# Patient Record
Sex: Female | Born: 1960 | Race: White | Hispanic: No | Marital: Married | Smoking: Current every day smoker
Health system: Southern US, Community
[De-identification: ages and names within clinical notes are randomized; demographics above are authoritative.]

## PROBLEM LIST (undated history)

## (undated) DIAGNOSIS — F32A Depression, unspecified: Secondary | ICD-10-CM

## (undated) DIAGNOSIS — E785 Hyperlipidemia, unspecified: Secondary | ICD-10-CM

## (undated) DIAGNOSIS — F329 Major depressive disorder, single episode, unspecified: Secondary | ICD-10-CM

## (undated) DIAGNOSIS — F419 Anxiety disorder, unspecified: Secondary | ICD-10-CM

## (undated) DIAGNOSIS — I10 Essential (primary) hypertension: Secondary | ICD-10-CM

## (undated) DIAGNOSIS — J40 Bronchitis, not specified as acute or chronic: Secondary | ICD-10-CM

## (undated) HISTORY — PX: ABDOMINAL HYSTERECTOMY: SHX81

## (undated) HISTORY — PX: TUBAL LIGATION: SHX77

## (undated) HISTORY — PX: WISDOM TOOTH EXTRACTION: SHX21

---

## 2005-03-20 ENCOUNTER — Ambulatory Visit: Payer: Self-pay | Admitting: Family Medicine

## 2006-11-21 ENCOUNTER — Emergency Department: Payer: Self-pay | Admitting: Emergency Medicine

## 2006-12-31 ENCOUNTER — Ambulatory Visit: Payer: Self-pay | Admitting: Family Medicine

## 2008-11-25 ENCOUNTER — Ambulatory Visit: Payer: Self-pay | Admitting: Physician Assistant

## 2008-12-07 ENCOUNTER — Ambulatory Visit: Payer: Self-pay | Admitting: Family Medicine

## 2010-03-10 ENCOUNTER — Ambulatory Visit: Payer: Self-pay | Admitting: Family Medicine

## 2010-03-21 ENCOUNTER — Ambulatory Visit: Payer: Self-pay | Admitting: Family Medicine

## 2010-10-07 ENCOUNTER — Ambulatory Visit: Payer: Self-pay

## 2012-02-20 ENCOUNTER — Ambulatory Visit: Payer: Self-pay | Admitting: Family Medicine

## 2012-02-26 ENCOUNTER — Ambulatory Visit: Payer: Self-pay | Admitting: Family Medicine

## 2013-05-07 ENCOUNTER — Ambulatory Visit: Payer: Self-pay | Admitting: Family Medicine

## 2014-05-19 ENCOUNTER — Ambulatory Visit: Payer: Self-pay | Admitting: Family Medicine

## 2014-05-19 IMAGING — CT CT HEAD WITHOUT CONTRAST
1 series · 16 of 30 positions shown, 20 images · non-contrast
Comparison: None.

CLINICAL DATA: Headache for 3 days

EXAM:
CT HEAD WITHOUT CONTRAST
TECHNIQUE: Contiguous axial images were obtained from the base of the skull
through the vertex without intravenous contrast.

[Series 2: head wo · axial · 0.39mm/px · z∈[-74,+52]mm · 16 of 32 slices shown, 20 images]
[im 2/32  brain]
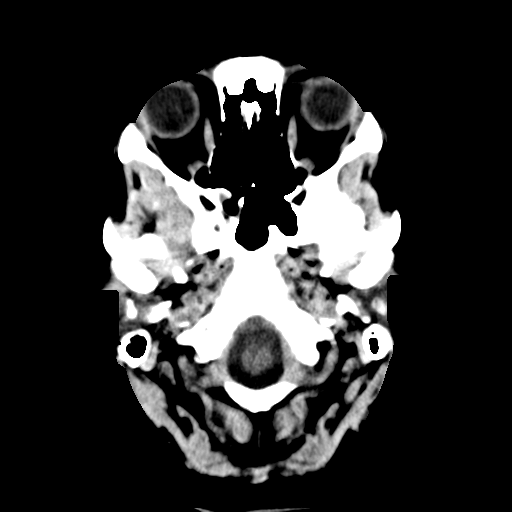
[im 2/32  bone]
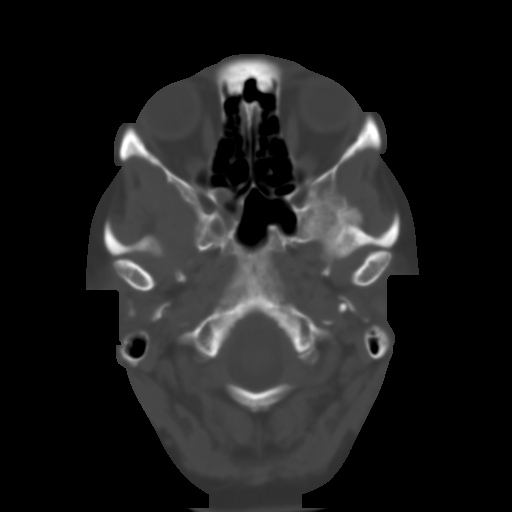
[im 4/32  brain]
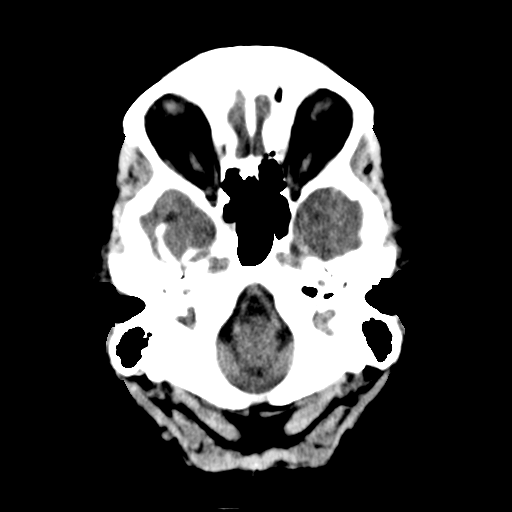
[im 6/32  brain]
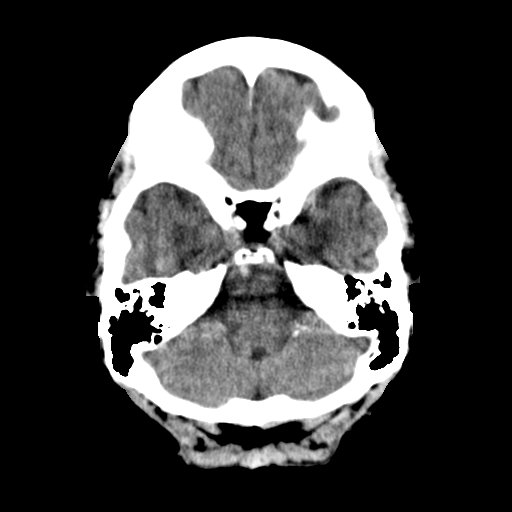
[im 8/32  brain]
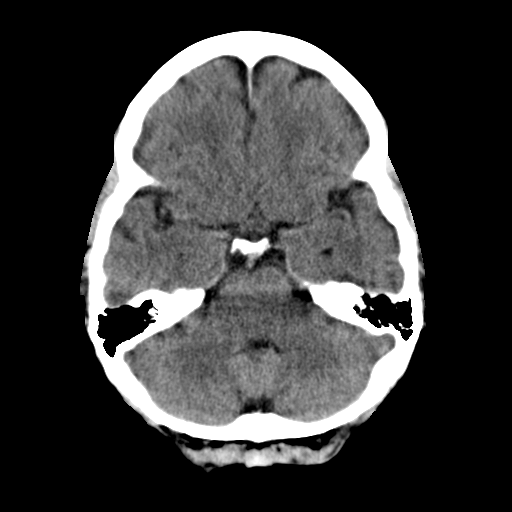
[im 9/32  brain]
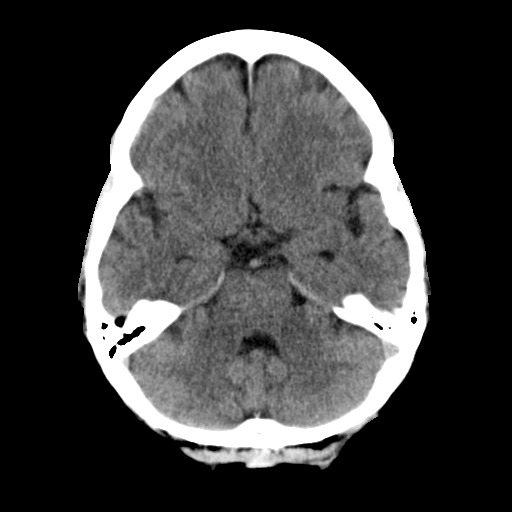
[im 9/32  bone]
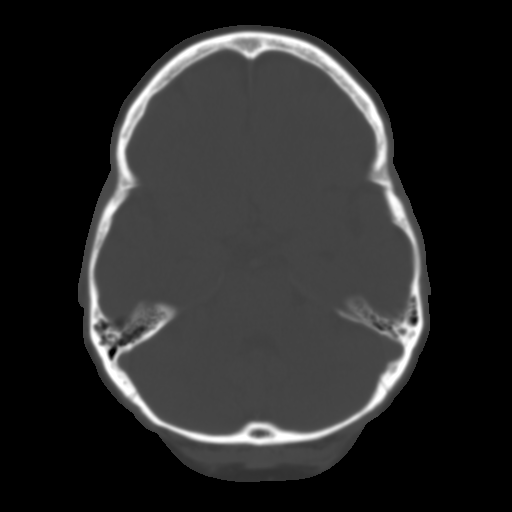
[im 11/32  brain]
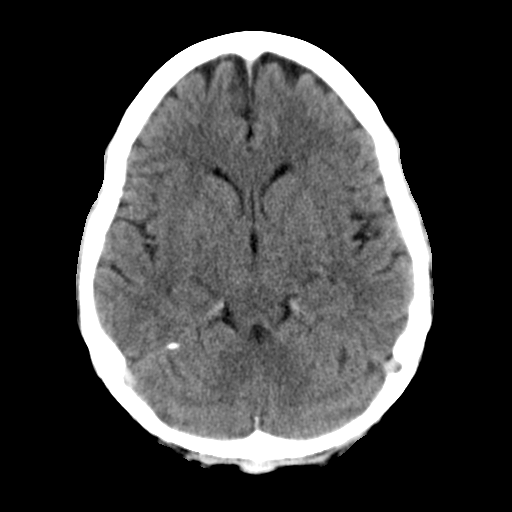
[im 13/32  brain]
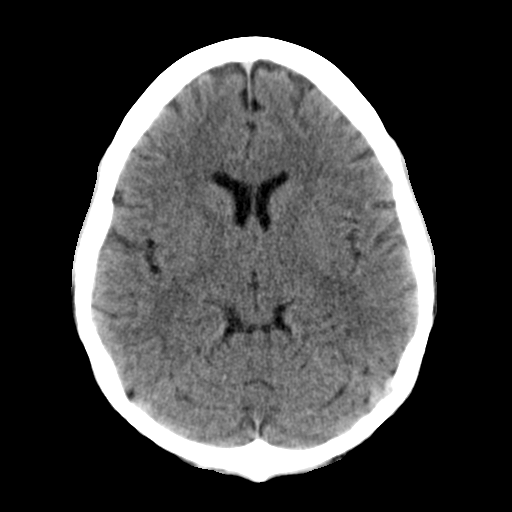
[im 15/32  brain]
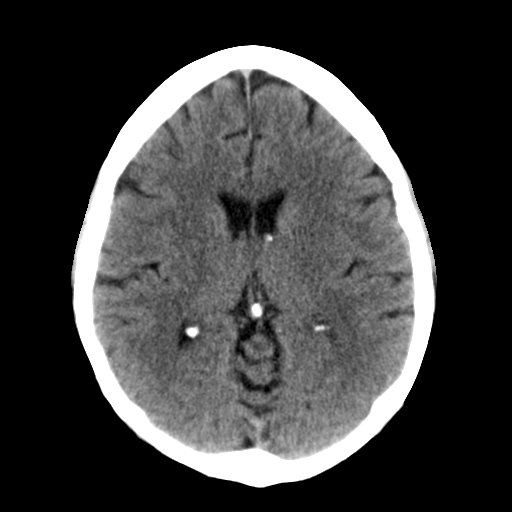
[im 17/32  brain]
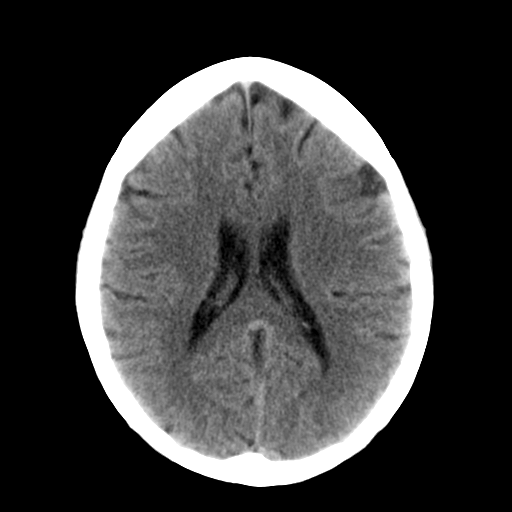
[im 17/32  bone]
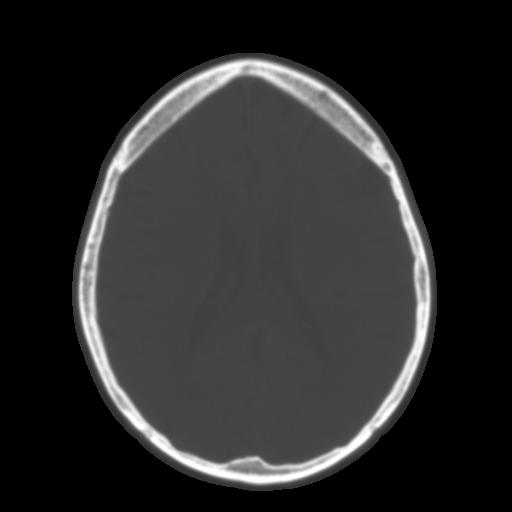
[im 19/32  brain]
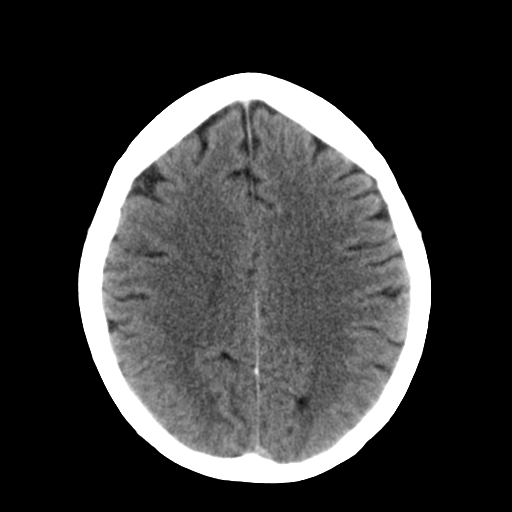
[im 21/32  brain]
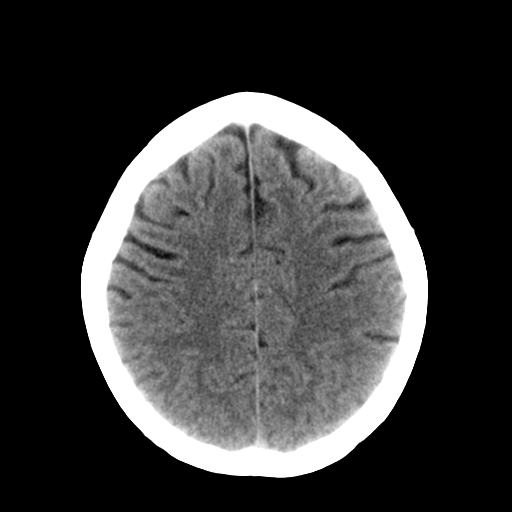
[im 23/32  brain]
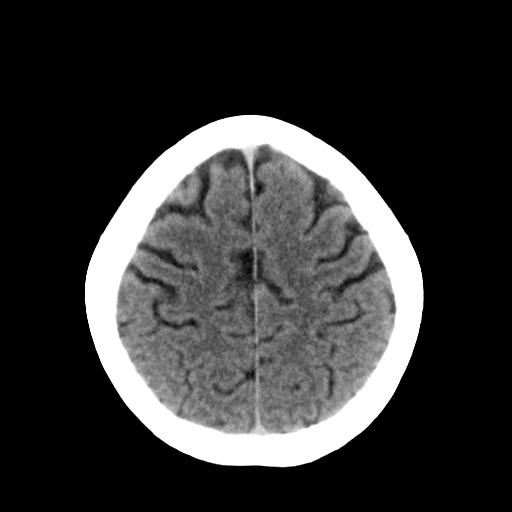
[im 24/32  brain]
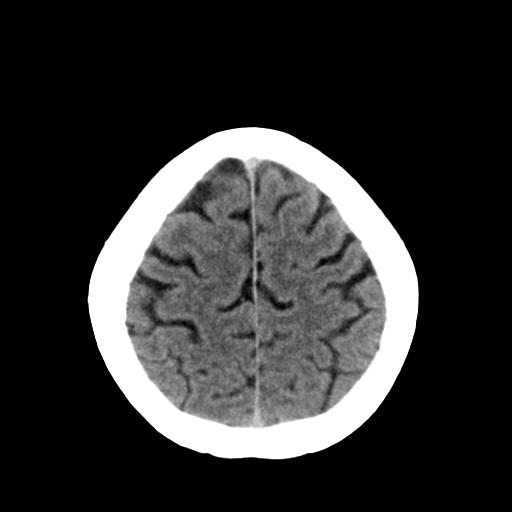
[im 24/32  bone]
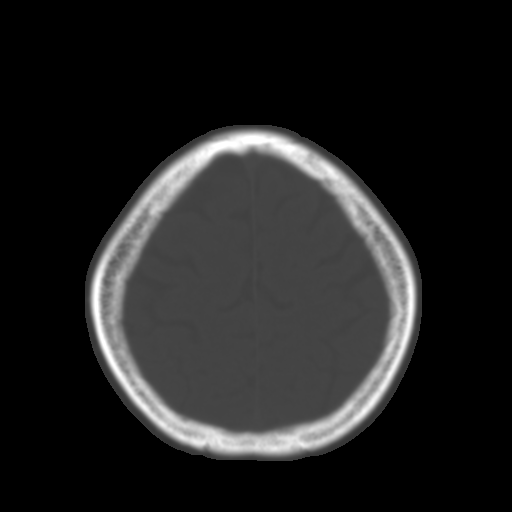
[im 26/32  brain]
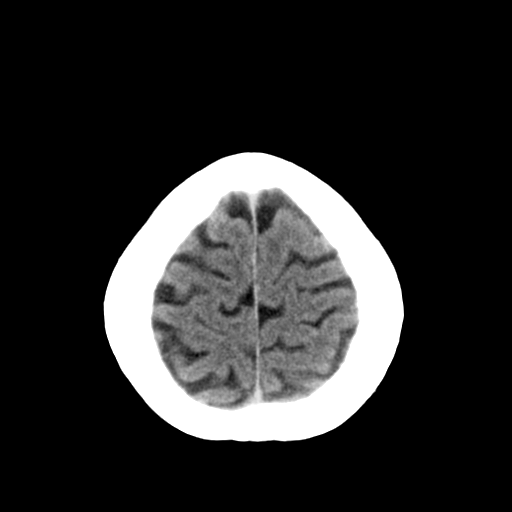
[im 28/32  brain]
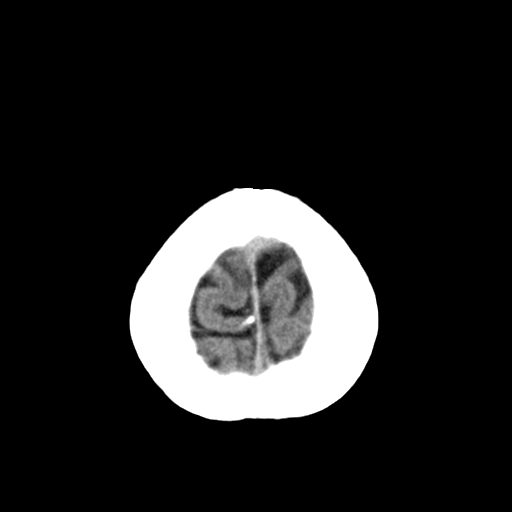
[im 30/32  brain]
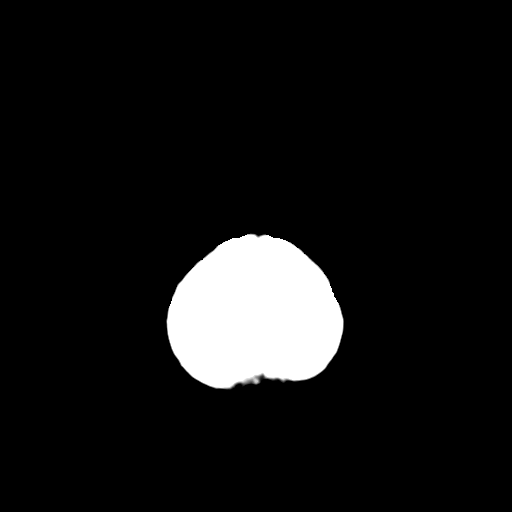

[16 of 30 positions shown; findings below may reference images not displayed]

FINDINGS: No skull fracture is noted. Paranasal sinuses and mastoid air cells
are unremarkable. No intracranial hemorrhage, mass effect or midline
shift. No acute infarction. No mass lesion is noted on this
unenhanced scan. The gray and white-matter differentiation is
preserved. No hydrocephalus.
IMPRESSION: No acute intracranial abnormality.

## 2014-10-30 ENCOUNTER — Ambulatory Visit: Payer: Self-pay | Admitting: Obstetrics and Gynecology

## 2014-11-05 ENCOUNTER — Ambulatory Visit: Payer: Self-pay | Admitting: Obstetrics and Gynecology

## 2015-02-05 ENCOUNTER — Other Ambulatory Visit: Payer: Self-pay | Admitting: Family Medicine

## 2015-02-05 DIAGNOSIS — R1032 Left lower quadrant pain: Secondary | ICD-10-CM

## 2015-02-09 ENCOUNTER — Ambulatory Visit
Admission: RE | Admit: 2015-02-09 | Discharge: 2015-02-09 | Disposition: A | Discharge status: Home / Self Care | Payer: BLUE CROSS/BLUE SHIELD | Source: Ambulatory Visit | Attending: Family Medicine | Admitting: Family Medicine

## 2015-02-09 ENCOUNTER — Other Ambulatory Visit: Payer: Self-pay | Admitting: Family Medicine

## 2015-02-09 DIAGNOSIS — R1032 Left lower quadrant pain: Secondary | ICD-10-CM | POA: Insufficient documentation

## 2015-02-09 MED ORDER — IOHEXOL 350 MG/ML SOLN
100.0000 mL | Freq: Once | INTRAVENOUS | Status: AC | PRN
  Administered 2015-02-09: 100 mL via INTRAVENOUS

## 2015-04-06 ENCOUNTER — Other Ambulatory Visit: Payer: Self-pay | Admitting: Obstetrics and Gynecology

## 2015-04-06 DIAGNOSIS — N6002 Solitary cyst of left breast: Secondary | ICD-10-CM

## 2015-05-13 ENCOUNTER — Encounter: Payer: Self-pay | Admitting: *Deleted

## 2015-05-14 ENCOUNTER — Ambulatory Visit
Admission: RE | Admit: 2015-05-14 | Discharge: 2015-05-14 | Disposition: A | Discharge status: Home / Self Care | Payer: BLUE CROSS/BLUE SHIELD | Source: Ambulatory Visit | Attending: Gastroenterology | Admitting: Gastroenterology

## 2015-05-14 ENCOUNTER — Encounter: Payer: Self-pay | Admitting: Anesthesiology

## 2015-05-14 ENCOUNTER — Ambulatory Visit: Payer: BLUE CROSS/BLUE SHIELD | Admitting: Certified Registered"

## 2015-05-14 ENCOUNTER — Encounter
Admission: RE | Disposition: A | Discharge status: Home / Self Care | Payer: Self-pay | Source: Ambulatory Visit | Attending: Gastroenterology

## 2015-05-14 DIAGNOSIS — I10 Essential (primary) hypertension: Secondary | ICD-10-CM | POA: Diagnosis not present

## 2015-05-14 DIAGNOSIS — K640 First degree hemorrhoids: Secondary | ICD-10-CM | POA: Insufficient documentation

## 2015-05-14 DIAGNOSIS — F17210 Nicotine dependence, cigarettes, uncomplicated: Secondary | ICD-10-CM | POA: Diagnosis not present

## 2015-05-14 DIAGNOSIS — K621 Rectal polyp: Secondary | ICD-10-CM | POA: Diagnosis not present

## 2015-05-14 DIAGNOSIS — E785 Hyperlipidemia, unspecified: Secondary | ICD-10-CM | POA: Insufficient documentation

## 2015-05-14 DIAGNOSIS — Z79899 Other long term (current) drug therapy: Secondary | ICD-10-CM | POA: Diagnosis not present

## 2015-05-14 DIAGNOSIS — F419 Anxiety disorder, unspecified: Secondary | ICD-10-CM | POA: Diagnosis not present

## 2015-05-14 DIAGNOSIS — Z1211 Encounter for screening for malignant neoplasm of colon: Secondary | ICD-10-CM | POA: Diagnosis not present

## 2015-05-14 DIAGNOSIS — K5730 Diverticulosis of large intestine without perforation or abscess without bleeding: Secondary | ICD-10-CM | POA: Diagnosis not present

## 2015-05-14 DIAGNOSIS — D125 Benign neoplasm of sigmoid colon: Secondary | ICD-10-CM | POA: Insufficient documentation

## 2015-05-14 DIAGNOSIS — F329 Major depressive disorder, single episode, unspecified: Secondary | ICD-10-CM | POA: Diagnosis not present

## 2015-05-14 HISTORY — DX: Essential (primary) hypertension: I10

## 2015-05-14 HISTORY — DX: Depression, unspecified: F32.A

## 2015-05-14 HISTORY — PX: COLONOSCOPY WITH PROPOFOL: SHX5780

## 2015-05-14 HISTORY — DX: Bronchitis, not specified as acute or chronic: J40

## 2015-05-14 HISTORY — DX: Anxiety disorder, unspecified: F41.9

## 2015-05-14 HISTORY — DX: Hyperlipidemia, unspecified: E78.5

## 2015-05-14 HISTORY — DX: Major depressive disorder, single episode, unspecified: F32.9

## 2015-05-14 SURGERY — COLONOSCOPY WITH PROPOFOL
Anesthesia: General

## 2015-05-14 MED ORDER — PROPOFOL INFUSION 10 MG/ML OPTIME
INTRAVENOUS | Status: DC | PRN
  Administered 2015-05-14: 120 ug/kg/min via INTRAVENOUS

## 2015-05-14 MED ORDER — SODIUM CHLORIDE 0.9 % IV SOLN
INTRAVENOUS | Status: DC
  Administered 2015-05-14: 1000 mL via INTRAVENOUS

## 2015-05-14 MED ORDER — SODIUM CHLORIDE 0.9 % IV SOLN: INTRAVENOUS | Status: DC

## 2015-05-14 MED ORDER — LIDOCAINE HCL (CARDIAC) 20 MG/ML IV SOLN
INTRAVENOUS | Status: DC | PRN
  Administered 2015-05-14: 50 mg via INTRAVENOUS

## 2015-05-14 MED ORDER — PROPOFOL 10 MG/ML IV BOLUS
INTRAVENOUS | Status: DC | PRN
  Administered 2015-05-14: 30 mg via INTRAVENOUS
  Administered 2015-05-14: 20 mg via INTRAVENOUS
  Administered 2015-05-14: 70 mg via INTRAVENOUS

## 2015-05-14 NOTE — Transfer of Care (Signed)
Immediate Anesthesia Transfer of Care Note  Patient: Catherine Mcbride  Procedure(s) Performed: Procedure(s): COLONOSCOPY WITH PROPOFOL (N/A)  Patient Location: Endoscopy Unit  Anesthesia Type:General  Level of Consciousness: awake  Airway & Oxygen Therapy: Patient Spontanous Breathing and Patient connected to nasal cannula oxygen  Post-op Assessment: Report given to RN  Post vital signs: Reviewed  Last Vitals:  Filed Vitals:   05/14/15 1158  BP: 92/58  Pulse: 71  Temp: 35.8 C  Resp: 16    Complications: No apparent anesthesia complications

## 2015-05-14 NOTE — H&P (Signed)
  Primary Care Physician:  [facility], Chrissie Noa, MD  Pre-Procedure History & Physical: HPI:  Catherine Mcbride is a 54 y.o. female is here for an colonoscopy.   Past Medical History  Diagnosis Date  . Hypertension   . Anxiety   . Depression   . Bronchitis   . Hyperlipidemia     Past Surgical History  Procedure Laterality Date  . Abdominal hysterectomy    . Tubal ligation    . Wisdom tooth extraction      Prior to Admission medications   Medication Sig Start Date End Date Taking? Authorizing Provider  fluticasone (FLONASE) 50 MCG/ACT nasal spray Place 2 sprays into both nostrils daily.   Yes Historical Provider, MD  hydrochlorothiazide (HYDRODIURIL) 25 MG tablet Take 25 mg by mouth daily.   Yes Historical Provider, MD  lisinopril (PRINIVIL,ZESTRIL) 30 MG tablet Take 30 mg by mouth daily.   Yes Historical Provider, MD  niacin 500 MG tablet Take 500 mg by mouth daily with breakfast.   Yes Historical Provider, MD  OMEGA-3 FATTY ACIDS-VITAMIN E PO Take 1,000 mg by mouth daily.   Yes Historical Provider, MD    Allergies as of 05/11/2015  . (No Known Allergies)    History reviewed. No pertinent family history.  Social History   Social History  . Marital Status: Married    Spouse Name: N/A  . Number of Children: N/A  . Years of Education: N/A   Occupational History  . Not on file.   Social History Main Topics  . Smoking status: Current Every Day Smoker -- 1.00 packs/day  . Smokeless tobacco: Not on file  . Alcohol Use: Yes  . Drug Use: No  . Sexual Activity: Not on file   Other Topics Concern  . Not on file   Social History Narrative     Physical Exam: BP 149/88 mmHg  Pulse 72  Temp(Src) 97.7 F (36.5 C) (Tympanic)  Resp 16  Ht 5\' 1"  (1.549 m)  Wt 64.864 kg (143 lb)  BMI 27.03 kg/m2  SpO2 99% General:   Alert,  pleasant and cooperative in NAD Head:  Normocephalic and atraumatic. Neck:  Supple; no masses or thyromegaly. Lungs:  Clear throughout to  auscultation.    Heart:  Regular rate and rhythm. Abdomen:  Soft, nontender and nondistended. Normal bowel sounds, without guarding, and without rebound.   Neurologic:  Alert and  oriented x4;  grossly normal neurologically.  Impression/Plan: Catherine Mcbride is here for an colonoscopy to be performed for screening  Risks, benefits, limitations, and alternatives regarding  colonoscopy have been reviewed with the patient.  Questions have been answered.  All parties agreeable.   Catherine Class, MD  05/14/2015, 11:25 AM

## 2015-05-14 NOTE — Op Note (Signed)
[facility] Patient Name: Catherine Mcbride Procedure Date: 05/14/2015 11:23 AM MRN: [id number] Account #: [id number] Date of Birth: 08-Nov-1960 Admit Type: Outpatient Age: 54 Room: [facility] ENDO ROOM 2 Gender: Female Note Status: Finalized Procedure:         Colonoscopy Indications:       Screening for colorectal malignant neoplasm, This is the                     patient's first colonoscopy Patient Profile:   This is a 54 year old female. Providers:         Gerrit Heck. Rayann Heman, MD Referring MD:      Sofie Hartigan (Referring MD) Medicines:         Propofol per Anesthesia Complications:     No immediate complications. Procedure:         Pre-Anesthesia Assessment:                    - Prior to the procedure, a History and Physical was                     performed, and patient medications, allergies and                     sensitivities were reviewed. The patient's tolerance of                     previous anesthesia was reviewed.                    - Prior to the procedure, a History and Physical was                     performed, and patient medications, allergies and                     sensitivities were reviewed. The patient's tolerance of                     previous anesthesia was reviewed.                    After obtaining informed consent, the colonoscope was                     passed under direct vision. Throughout the procedure, the                     patient's blood pressure, pulse, and oxygen saturations                     were monitored continuously. The Colonoscope was                     introduced through the anus and advanced to the the cecum,                     identified by appendiceal orifice and ileocecal valve. The                     colonoscopy was performed without difficulty. The patient                     tolerated the procedure well. The quality of the bowel  preparation was excellent. Findings:    The perianal exam findings include non-thrombosed external hemorrhoids.      A 2 mm polyp was found in the sigmoid colon. The polyp was sessile. The       polyp was removed with a jumbo cold forceps. Resection and retrieval       were complete.      A 2 mm polyp was found in the rectum. The polyp was sessile. The polyp       was removed with a jumbo cold forceps. Resection and retrieval were       complete.      Internal hemorrhoids were found during retroflexion. The hemorrhoids       were Grade I (internal hemorrhoids that do not prolapse).      The exam was otherwise without abnormality. Impression:        - Non-thrombosed external hemorrhoids found on perianal                     exam.                    - One 2 mm polyp in the sigmoid colon. Resected and                     retrieved.                    - One 2 mm polyp in the rectum. Resected and retrieved.                    - Internal hemorrhoids.                    - The examination was otherwise normal. Recommendation:    - Observe patient in GI recovery unit.                    - High fiber diet.                    - Continue present medications.                    - Return to referring physician.                    - The findings and recommendations were discussed with the                     patient.                    - The findings and recommendations were discussed with the                     patient's family.                    - - Repeat colonoscopy for surveillance in 5 years if path                     is adenomatous, otherwise repeat for screening in 10 years. Procedure Code(s): --- Professional ---                    [phone], Colonoscopy, flexible; diagnostic, including                     collection of specimen(s) by brushing  or washing, when                     performed (separate procedure) CPT copyright 2014 American [employer]. All rights reserved. The codes documented in this report are  preliminary and upon coder review may  be revised to meet current compliance requirements. Mellody Life, MD 05/14/2015 12:00:20 PM This report has been signed electronically. Number of Addenda: 0 Note Initiated On: 05/14/2015 11:23 AM Scope Withdrawal Time: 0 hours 14 minutes 42 seconds  Total Procedure Duration: 0 hours 24 minutes 54 seconds       [facility]

## 2015-05-14 NOTE — Anesthesia Preprocedure Evaluation (Signed)
Anesthesia Evaluation  Patient identified by MRN, date of birth, ID band Patient awake    Reviewed: Allergy & Precautions, H&P , NPO status , Patient's Chart, lab work & pertinent test results, reviewed documented beta blocker date and time   History of Anesthesia Complications (+) PONV and history of anesthetic complications  Airway Mallampati: II  TM Distance: >3 FB Neck ROM: full    Dental no notable dental hx. (+) Teeth Intact   Pulmonary neg shortness of breath, neg sleep apnea, neg COPDneg recent URI, Current Smoker,  breath sounds clear to auscultation  Pulmonary exam normal       Cardiovascular Exercise Tolerance: Good hypertension, - angina- CAD, - Past MI, - Cardiac Stents and - CABG Normal cardiovascular exam- dysrhythmias - Valvular Problems/MurmursRhythm:regular Rate:Normal     Neuro/Psych PSYCHIATRIC DISORDERS (depression and anxiety) negative neurological ROS     GI/Hepatic negative GI ROS, Neg liver ROS,   Endo/Other  negative endocrine ROS  Renal/GU negative Renal ROS  negative genitourinary   Musculoskeletal   Abdominal   Peds  Hematology negative hematology ROS (+)   Anesthesia Other Findings Past Medical History:   Hypertension                                                 Anxiety                                                      Depression                                                   Bronchitis                                                   Hyperlipidemia                                               Reproductive/Obstetrics negative OB ROS                             Anesthesia Physical Anesthesia Plan  ASA: II  Anesthesia Plan: General   Post-op Pain Management:    Induction:   Airway Management Planned:   Additional Equipment:   Intra-op Plan:   Post-operative Plan:   Informed Consent: I have reviewed the patients History and Physical,  chart, labs and discussed the procedure including the risks, benefits and alternatives for the proposed anesthesia with the patient or authorized representative who has indicated his/her understanding and acceptance.   Dental Advisory Given  Plan Discussed with: Anesthesiologist, CRNA and Surgeon  Anesthesia Plan Comments:         Anesthesia Quick Evaluation

## 2015-05-16 NOTE — Anesthesia Postprocedure Evaluation (Signed)
  Anesthesia Post-op Note  Patient: Catherine Mcbride  Procedure(s) Performed: Procedure(s): COLONOSCOPY WITH PROPOFOL (N/A)  Anesthesia type:General  Patient location: PACU  Post pain: Pain level controlled  Post assessment: Post-op Vital signs reviewed, Patient's Cardiovascular Status Stable, Respiratory Function Stable, Patent Airway and No signs of Nausea or vomiting  Post vital signs: Reviewed and stable  Last Vitals:  Filed Vitals:   05/14/15 1230  BP: 132/72  Pulse: 68  Temp:   Resp: 17    Level of consciousness: awake, alert  and patient cooperative  Complications: No apparent anesthesia complications

## 2015-05-17 ENCOUNTER — Encounter: Payer: Self-pay | Admitting: Gastroenterology

## 2015-05-17 LAB — SURGICAL PATHOLOGY

## 2015-07-14 ENCOUNTER — Ambulatory Visit
Admission: RE | Admit: 2015-07-14 | Discharge: 2015-07-14 | Disposition: A | Discharge status: Home / Self Care | Payer: BLUE CROSS/BLUE SHIELD | Source: Ambulatory Visit | Attending: Obstetrics and Gynecology | Admitting: Obstetrics and Gynecology

## 2015-07-14 DIAGNOSIS — N63 Unspecified lump in breast: Secondary | ICD-10-CM | POA: Insufficient documentation

## 2015-07-14 DIAGNOSIS — N6002 Solitary cyst of left breast: Secondary | ICD-10-CM

## 2016-02-16 ENCOUNTER — Other Ambulatory Visit: Payer: Self-pay | Admitting: Family Medicine

## 2016-02-16 DIAGNOSIS — Z1231 Encounter for screening mammogram for malignant neoplasm of breast: Secondary | ICD-10-CM

## 2016-06-09 ENCOUNTER — Ambulatory Visit
Admission: RE | Admit: 2016-06-09 | Discharge: 2016-06-09 | Disposition: A | Discharge status: Home / Self Care | Payer: 59 | Source: Ambulatory Visit | Attending: Family Medicine | Admitting: Family Medicine

## 2016-06-09 DIAGNOSIS — N63 Unspecified lump in breast: Secondary | ICD-10-CM | POA: Diagnosis not present

## 2016-06-09 DIAGNOSIS — Z1231 Encounter for screening mammogram for malignant neoplasm of breast: Secondary | ICD-10-CM | POA: Diagnosis not present

## 2017-04-30 DIAGNOSIS — E781 Pure hyperglyceridemia: Secondary | ICD-10-CM | POA: Diagnosis not present

## 2017-05-07 DIAGNOSIS — J449 Chronic obstructive pulmonary disease, unspecified: Secondary | ICD-10-CM | POA: Diagnosis not present

## 2017-05-07 DIAGNOSIS — I10 Essential (primary) hypertension: Secondary | ICD-10-CM | POA: Diagnosis not present

## 2017-05-07 DIAGNOSIS — E781 Pure hyperglyceridemia: Secondary | ICD-10-CM | POA: Diagnosis not present

## 2017-11-07 DIAGNOSIS — E781 Pure hyperglyceridemia: Secondary | ICD-10-CM | POA: Diagnosis not present

## 2017-11-14 DIAGNOSIS — E781 Pure hyperglyceridemia: Secondary | ICD-10-CM | POA: Diagnosis not present

## 2017-11-14 DIAGNOSIS — J449 Chronic obstructive pulmonary disease, unspecified: Secondary | ICD-10-CM | POA: Diagnosis not present

## 2017-11-14 DIAGNOSIS — I10 Essential (primary) hypertension: Secondary | ICD-10-CM | POA: Diagnosis not present

## 2018-05-16 DIAGNOSIS — E781 Pure hyperglyceridemia: Secondary | ICD-10-CM | POA: Diagnosis not present

## 2018-05-23 DIAGNOSIS — R7301 Impaired fasting glucose: Secondary | ICD-10-CM | POA: Diagnosis not present

## 2018-05-23 DIAGNOSIS — I10 Essential (primary) hypertension: Secondary | ICD-10-CM | POA: Diagnosis not present

## 2018-05-23 DIAGNOSIS — E781 Pure hyperglyceridemia: Secondary | ICD-10-CM | POA: Diagnosis not present

## 2018-05-23 DIAGNOSIS — J449 Chronic obstructive pulmonary disease, unspecified: Secondary | ICD-10-CM | POA: Diagnosis not present

## 2018-08-05 ENCOUNTER — Other Ambulatory Visit: Payer: Self-pay | Admitting: Family Medicine

## 2018-08-05 DIAGNOSIS — Z1231 Encounter for screening mammogram for malignant neoplasm of breast: Secondary | ICD-10-CM

## 2019-12-26 ENCOUNTER — Ambulatory Visit: Payer: Self-pay | Attending: Internal Medicine

## 2019-12-26 DIAGNOSIS — Z23 Encounter for immunization: Secondary | ICD-10-CM

## 2019-12-26 NOTE — Progress Notes (Signed)
   Covid-19 Vaccination Clinic  Name:  Catherine Mcbride    MRN: [id number] DOB: [birth year]  12/26/2019  [name] was observed post Covid-19 immunization for 15 minutes without incident. She was provided with Vaccine Information Sheet and instruction to access the V-Safe system.   [name] was instructed to call 911 with any severe reactions post vaccine: Marland Kitchen Difficulty breathing  . Swelling of face and throat  . A fast heartbeat  . A bad rash all over body  . Dizziness and weakness   Immunizations Administered    Name Date Dose VIS Date Route   Moderna COVID-19 Vaccine 12/26/2019  1:30 PM 0.5 mL 09/02/2019 Intramuscular   Manufacturer: Moderna   Lot: [id number]   [place]:[id number]

## 2020-01-23 ENCOUNTER — Ambulatory Visit: Payer: Self-pay | Attending: Internal Medicine

## 2020-01-23 DIAGNOSIS — Z23 Encounter for immunization: Secondary | ICD-10-CM

## 2020-01-23 NOTE — Progress Notes (Signed)
   Covid-19 Vaccination Clinic  Name:  Catherine Mcbride    MRN: [id number] DOB: Jun 09, 1961  01/23/2020  Ms. Staples was observed post Covid-19 immunization for 30 minutes based on pre-vaccination screening without incident. She was provided with Vaccine Information Sheet and instruction to access the V-Safe system.   [name] was instructed to call 911 with any severe reactions post vaccine: Marland Kitchen Difficulty breathing  . Swelling of face and throat  . A fast heartbeat  . A bad rash all over body  . Dizziness and weakness   Immunizations Administered    Name Date Dose VIS Date Route   Moderna COVID-19 Vaccine 01/23/2020  1:10 PM 0.5 mL 09/2019 Intramuscular   Manufacturer: Moderna   Lot: [id number]   [place]:[id number]

## 2020-07-19 ENCOUNTER — Other Ambulatory Visit: Payer: Self-pay | Admitting: Family Medicine

## 2020-07-19 DIAGNOSIS — N6320 Unspecified lump in the left breast, unspecified quadrant: Secondary | ICD-10-CM

## 2020-07-19 DIAGNOSIS — R928 Other abnormal and inconclusive findings on diagnostic imaging of breast: Secondary | ICD-10-CM

## 2020-08-16 ENCOUNTER — Ambulatory Visit: Payer: Self-pay

## 2020-08-16 ENCOUNTER — Ambulatory Visit
Admission: RE | Admit: 2020-08-16 | Discharge: 2020-08-16 | Disposition: A | Discharge status: Home / Self Care | Payer: BC Managed Care – PPO | Source: Ambulatory Visit | Attending: Family Medicine | Admitting: Family Medicine

## 2020-08-16 ENCOUNTER — Other Ambulatory Visit: Payer: Self-pay

## 2020-08-16 DIAGNOSIS — R928 Other abnormal and inconclusive findings on diagnostic imaging of breast: Secondary | ICD-10-CM

## 2020-08-16 DIAGNOSIS — N6320 Unspecified lump in the left breast, unspecified quadrant: Secondary | ICD-10-CM | POA: Insufficient documentation

## 2020-12-23 ENCOUNTER — Other Ambulatory Visit: Payer: Self-pay

## 2020-12-23 ENCOUNTER — Ambulatory Visit
Admission: RE | Admit: 2020-12-23 | Discharge: 2020-12-23 | Disposition: A | Discharge status: Home / Self Care | Payer: BC Managed Care – PPO | Source: Ambulatory Visit | Attending: Sports Medicine | Admitting: Sports Medicine

## 2020-12-23 VITALS — BP 136/95 | HR 81 | Temp 98.1°F | Resp 18 | Ht 61.0 in | Wt 143.1 lb

## 2020-12-23 DIAGNOSIS — M542 Cervicalgia: Secondary | ICD-10-CM

## 2020-12-23 DIAGNOSIS — M5412 Radiculopathy, cervical region: Secondary | ICD-10-CM

## 2020-12-23 DIAGNOSIS — M62838 Other muscle spasm: Secondary | ICD-10-CM

## 2020-12-23 MED ORDER — PREDNISONE 10 MG (21) PO TBPK: ORAL_TABLET | Freq: Every day | ORAL | 0 refills | Status: DC

## 2020-12-23 MED ORDER — METHOCARBAMOL 500 MG PO TABS: 500.0000 mg | ORAL_TABLET | Freq: Two times a day (BID) | ORAL | 0 refills | Status: DC

## 2020-12-23 NOTE — ED Triage Notes (Signed)
Pt c/o neck pain that radiates to the left side of her upper back. Started about 5 days ago. She has tried and ice pack. She states about a week ago she had a cramp in her neck.

## 2020-12-23 NOTE — Discharge Instructions (Addendum)
Your exam is consistent with neck pain, muscle spasm with probable cervical radiculitis which is coming from your spinal cord in your neck.  I have included educational handouts. Prescribed prednisone and a muscle relaxer. You should follow-up with orthopedics and I gave you the name of someone to call.  You may need physical therapy plus or minus an injection plus or minus an MRI.  I will leave it to their discretion.  X-rays were not indicated today as was no trauma.  I do not want you taking any Motrin, Advil, ibuprofen, Naprosyn, or Aleve or aspirin products while taking the prednisone.  Just take Tylenol only. If your symptoms worsen in any way please go to the emergency room for higher level of care and possible imaging.  I hope you get to feeling better, Dr. Drema Dallas

## 2020-12-23 NOTE — ED Provider Notes (Signed)
[facility]    CSN: [id number] Arrival date & time: 12/23/20  1442      History   Chief Complaint Chief Complaint  Patient presents with  . Appointment  . Shoulder Pain  . Neck Pain    HPI CANIYAH Mcbride is a 60 y.o. female.   Patient pleasant 60 year old female who presents for evaluation of the above issues.  She reports her symptoms began around 5 to 7 days ago.  It started with a cramp in the left side of her neck and now has progressed down into the trapezius and into the medial scapular border on the left side.  She is having pain at night that at times is really bad and she cannot get comfortable.  She said that she is able to sleep okay if she gets into the right position.  Normally sees Catherine Mcbride for ongoing medical care but could not get in to see him today.  She describes the pain as sharp and stabbing at times.  She does get occasional numbness and tingling that goes into her hand for short amount of time.  She cannot localize this.  She denies any weakness she is not dropping any objects.  She says that her symptoms do get worse if she puts her arm at her side and attempts to do anything with her arm in abduction.  If she has her elbow in by her side things were fine.  She denies any vision changes jaw pain chest pain or shortness of breath.  No cardiac or stroke symptoms elicited on history.  No red flag signs or symptoms.     Past Medical History:  Diagnosis Date  . Anxiety   . Bronchitis   . Depression   . Hyperlipidemia   . Hypertension     There are no problems to display for this patient.   Past Surgical History:  Procedure Laterality Date  . ABDOMINAL HYSTERECTOMY    . COLONOSCOPY WITH PROPOFOL N/A 05/14/2015   Procedure: COLONOSCOPY WITH PROPOFOL;  Surgeon: Josefine Class, MD;  Location: [facility] ENDOSCOPY;  Service: Endoscopy;  Laterality: N/A;  . TUBAL LIGATION    . WISDOM TOOTH EXTRACTION      OB History   No obstetric  history on file.      Home Medications    Prior to Admission medications   Medication Sig Start Date End Date Taking? Authorizing Provider  hydrochlorothiazide (HYDRODIURIL) 25 MG tablet Take 25 mg by mouth daily.   Yes [provider]  lisinopril (PRINIVIL,ZESTRIL) 30 MG tablet Take 30 mg by mouth daily.   Yes [provider]  methocarbamol (ROBAXIN) 500 MG tablet Take 1 tablet (500 mg total) by mouth 2 (two) times daily. 12/23/20  Yes Verda Cumins, MD  niacin 500 MG tablet Take 500 mg by mouth daily with breakfast.   Yes [provider]  OMEGA-3 FATTY ACIDS-VITAMIN E PO Take 1,000 mg by mouth daily.   Yes [provider]  predniSONE (STERAPRED UNI-PAK 21 TAB) 10 MG (21) TBPK tablet Take by mouth daily. Take 6 tabs by mouth daily  for 2 days, then 5 tabs for 2 days, then 4 tabs for 2 days, then 3 tabs for 2 days, 2 tabs for 2 days, then 1 tab by mouth daily for 2 days 12/23/20  Yes Verda Cumins, MD  fluticasone [facility]) 50 MCG/ACT nasal spray Place 2 sprays into both nostrils daily.    [provider]  gemfibrozil (LOPID) 600  MG tablet  08/09/20   [provider]    Family History Family History  Problem Relation Age of Onset  . Breast cancer Neg Hx     Social History Social History   Tobacco Use  . Smoking status: Current Every Day Smoker    Packs/day: 1.00  . Smokeless tobacco: Never Used  Vaping Use  . Vaping Use: Never used  Substance Use Topics  . Alcohol use: Yes  . Drug use: No     Allergies   Patient has no known allergies.   Review of Systems Review of Systems  Constitutional: Negative.  Negative for activity change, appetite change, chills, diaphoresis, fatigue and fever.  HENT: Negative.   Eyes: Negative.  Negative for pain.  Respiratory: Negative.  Negative for cough, chest tightness, shortness of breath, wheezing and stridor.   Cardiovascular: Negative.  Negative for chest pain and palpitations.   Gastrointestinal: Negative.  Negative for abdominal pain.  Endocrine: Negative.   Genitourinary: Negative.   Musculoskeletal: Positive for neck pain and neck stiffness. Negative for arthralgias, back pain, gait problem, joint swelling and myalgias.  Skin: Negative.  Negative for color change, pallor, rash and wound.  Neurological: Positive for numbness. Negative for dizziness, tremors, seizures, syncope, weakness, light-headedness and headaches.  All other systems reviewed and are negative.    Physical Exam Triage Vital Signs ED Triage Vitals  Enc Vitals Group     BP 12/23/20 1522 (!) 136/95     Pulse Rate 12/23/20 1522 81     Resp 12/23/20 1522 18     Temp 12/23/20 1522 98.1 F (36.7 C)     Temp Source 12/23/20 1522 Oral     SpO2 12/23/20 1522 98 %     Weight 12/23/20 1520 143 lb 1.3 oz (64.9 kg)     Height 12/23/20 1520 5\' 1"  (1.549 m)     Head Circumference --      Peak Flow --      Pain Score 12/23/20 1520 3     Pain Loc --      Pain Edu? --      Excl. in [place]? --    No data found.  Updated Vital Signs BP (!) 136/95 (BP Location: Right Arm)   Pulse 81   Temp 98.1 F (36.7 C) (Oral)   Resp 18   Ht 5\' 1"  (1.549 m)   Wt 64.9 kg   SpO2 98%   BMI 27.03 kg/m   Visual Acuity Right Eye Distance:   Left Eye Distance:   Bilateral Distance:    Right Eye Near:   Left Eye Near:    Bilateral Near:     Physical Exam Vitals and nursing note reviewed.  Constitutional:      General: She is not in acute distress.    Appearance: Normal appearance. She is not ill-appearing, toxic-appearing or diaphoretic.     Comments: Uncomfortable appearing.  HENT:     Head: Normocephalic and atraumatic.     Nose: No congestion or rhinorrhea.     Mouth/Throat:     Pharynx: No oropharyngeal exudate or posterior oropharyngeal erythema.  Eyes:     General:        Right eye: No discharge.        Left eye: No discharge.     Extraocular Movements: Extraocular movements intact.      Pupils: Pupils are equal, round, and reactive to light.  Cardiovascular:     Rate and Rhythm: Normal rate and  regular rhythm.     Pulses: Normal pulses.     Heart sounds: Normal heart sounds. No murmur heard. No friction rub. No gallop.   Pulmonary:     Effort: Pulmonary effort is normal. No respiratory distress.     Breath sounds: Normal breath sounds. No stridor. No wheezing, rhonchi or rales.  Musculoskeletal:        General: Tenderness present. No swelling, deformity or signs of injury.     Cervical back: Normal range of motion and neck supple.     Comments: Cervical spine: There is some tenderness to palpation over the lower vertebrae's and facets of the cervical spine.  There is also tenderness in the soft tissues emanating from the base of the occiput down through the trapezius on the left side.  She is tender along the medial scapular border with associated spasm.  Exam is consistent with cervical radiculitis.  She has acceptable range of motion of the cervical spine.  Symptoms are accentuated with extension of her neck.  Spurling's test is positive and does cause radicular symptoms down the left arm.  That said, strength is well-preserved with flexion and extension of the neck as well as lateral side bends and rotation.  Bilateral upper extremity strength is 5 out of 5 in all planes.  2+ DTRs bilateral upper extremity.  Skin:    General: Skin is warm and dry.     Capillary Refill: Capillary refill takes less than 2 seconds.     Coloration: Skin is not jaundiced.     Findings: No bruising, erythema, lesion or rash.  Neurological:     General: No focal deficit present.     Mental Status: She is alert and oriented to person, place, and time.     Cranial Nerves: No cranial nerve deficit.     Sensory: No sensory deficit.     Motor: No weakness.     Coordination: Coordination normal.     Deep Tendon Reflexes: Reflexes normal.      UC Treatments / Results  Labs (all labs ordered  are listed, but only abnormal results are displayed) Labs Reviewed - No data to display  EKG   Radiology No results found.  Procedures Procedures (including critical care time)  Medications Ordered in UC Medications - No data to display  Initial Impression / Assessment and Plan / UC Course  I have reviewed the triage vital signs and the nursing notes.  Pertinent labs & imaging results that were available during my care of the patient were reviewed by me and considered in my medical decision making (see chart for details).  Clinical impression: Left-sided neck pain with cervical radiculitis.  She does not have weakness.  Some intermittent numbness and tingling.  There is some associated neck muscle spasm.  Treatment plan: 1.  The findings and treatment plan were discussed in detail with the patient.  Patient was in agreement. 2.  We discussed doing x-rays but I felt an orthopedic referral was the next step and they would want to do their own films.  We will hold on doing x-rays as there was no trauma. 3.  Prescribed a prednisone taper and a muscle relaxer. 4.  She will probably need some form of physical therapy plus or minus an injection with pain management.  She may also need an MRI.  I will leave it to orthopedics discretion.  We will make a formal referral to orthopedics. 5.  Given should be on prednisone I do not  want her to take any other anti-inflammatories.  She Catherine supplement with Tylenol.  No pain medicines were prescribed. 6.  If symptoms are were to worsen in any way she knows to go directly to the emergency room.  Otherwise follow-up with orthopedics as scheduled. 7.  Follow-up here as needed.    Final Clinical Impressions(s) / UC Diagnoses   Final diagnoses:  Neck pain  Cervical radiculitis  Neck muscle spasm     Discharge Instructions     Your exam is consistent with neck pain, muscle spasm with probable cervical radiculitis which is coming from your spinal  cord in your neck.  I have included educational handouts. Prescribed prednisone and a muscle relaxer. You should follow-up with orthopedics and I gave you the name of someone to call.  You may need physical therapy plus or minus an injection plus or minus an MRI.  I will leave it to their discretion.  X-rays were not indicated today as was no trauma.  I do not want you taking any Motrin, Advil, ibuprofen, Naprosyn, or Aleve or aspirin products while taking the prednisone.  Just take Tylenol only. If your symptoms worsen in any way please go to the emergency room for higher level of care and possible imaging.  I hope you get to feeling better, Dr. Drema Dallas   ED Prescriptions    Medication Sig Dispense Auth. Provider   predniSONE (STERAPRED UNI-PAK 21 TAB) 10 MG (21) TBPK tablet Take by mouth daily. Take 6 tabs by mouth daily  for 2 days, then 5 tabs for 2 days, then 4 tabs for 2 days, then 3 tabs for 2 days, 2 tabs for 2 days, then 1 tab by mouth daily for 2 days 42 tablet Verda Cumins, MD   methocarbamol (ROBAXIN) 500 MG tablet Take 1 tablet (500 mg total) by mouth 2 (two) times daily. 20 tablet Verda Cumins, MD     PDMP not reviewed this encounter.   Verda Cumins, MD 12/29/20 1410

## 2022-10-27 ENCOUNTER — Other Ambulatory Visit: Payer: Self-pay | Admitting: Family Medicine

## 2022-10-27 DIAGNOSIS — Z1231 Encounter for screening mammogram for malignant neoplasm of breast: Secondary | ICD-10-CM

## 2022-12-13 ENCOUNTER — Ambulatory Visit
Admission: RE | Admit: 2022-12-13 | Discharge: 2022-12-13 | Disposition: A | Discharge status: Home / Self Care | Payer: BC Managed Care – PPO | Source: Ambulatory Visit | Attending: Family Medicine | Admitting: Family Medicine

## 2022-12-13 DIAGNOSIS — Z1231 Encounter for screening mammogram for malignant neoplasm of breast: Secondary | ICD-10-CM | POA: Insufficient documentation

## 2023-01-26 ENCOUNTER — Emergency Department
Admission: EM | Admit: 2023-01-26 | Discharge: 2023-01-26 | Disposition: A | Discharge status: Home / Self Care | Payer: BC Managed Care – PPO | Source: Home / Self Care | Attending: Emergency Medicine | Admitting: Emergency Medicine

## 2023-01-26 ENCOUNTER — Encounter: Payer: Self-pay | Admitting: Emergency Medicine

## 2023-01-26 ENCOUNTER — Emergency Department: Payer: BC Managed Care – PPO

## 2023-01-26 DIAGNOSIS — N200 Calculus of kidney: Secondary | ICD-10-CM | POA: Insufficient documentation

## 2023-01-26 DIAGNOSIS — K315 Obstruction of duodenum: Secondary | ICD-10-CM | POA: Diagnosis not present

## 2023-01-26 DIAGNOSIS — D72829 Elevated white blood cell count, unspecified: Secondary | ICD-10-CM | POA: Insufficient documentation

## 2023-01-26 DIAGNOSIS — R1013 Epigastric pain: Secondary | ICD-10-CM | POA: Diagnosis not present

## 2023-01-26 DIAGNOSIS — R1084 Generalized abdominal pain: Secondary | ICD-10-CM

## 2023-01-26 LAB — CBC
HCT: 38.1 % (ref 36.0–46.0)
Hemoglobin: 13.6 g/dL (ref 12.0–15.0)
MCH: 31.5 pg (ref 26.0–34.0)
MCHC: 35.7 g/dL (ref 30.0–36.0)
MCV: 88.2 fL (ref 80.0–100.0)
Platelets: 284 10*3/uL (ref 150–400)
RBC: 4.32 MIL/uL (ref 3.87–5.11)
RDW: 12.9 % (ref 11.5–15.5)
WBC: 11.9 10*3/uL — ABNORMAL HIGH (ref 4.0–10.5)
nRBC: 0 % (ref 0.0–0.2)

## 2023-01-26 LAB — COMPREHENSIVE METABOLIC PANEL
ALT: 24 U/L (ref 0–44)
AST: 30 U/L (ref 15–41)
Albumin: 4.1 g/dL (ref 3.5–5.0)
Alkaline Phosphatase: 70 U/L (ref 38–126)
Anion gap: 13 (ref 5–15)
BUN: 17 mg/dL (ref 8–23)
CO2: 24 mmol/L (ref 22–32)
Calcium: 9.3 mg/dL (ref 8.9–10.3)
Chloride: 100 mmol/L (ref 98–111)
Creatinine, Ser: 0.76 mg/dL (ref 0.44–1.00)
GFR, Estimated: >60 mL/min (ref >60)
Glucose, Bld: 201 mg/dL — ABNORMAL HIGH (ref 70–99)
Potassium: 3.7 mmol/L (ref 3.5–5.1)
Sodium: 137 mmol/L (ref 135–145)
Total Bilirubin: 0.8 mg/dL (ref 0.3–1.2)
Total Protein: 7.6 g/dL (ref 6.5–8.1)

## 2023-01-26 LAB — URINALYSIS, ROUTINE W REFLEX MICROSCOPIC
Bilirubin Urine: NEGATIVE
Glucose, UA: NEGATIVE mg/dL
Hgb urine dipstick: NEGATIVE
Ketones, ur: NEGATIVE mg/dL
Leukocytes,Ua: NEGATIVE
Nitrite: NEGATIVE
Protein, ur: NEGATIVE mg/dL
Specific Gravity, Urine: >1.046 — ABNORMAL HIGH (ref 1.005–1.030)
pH: 5 (ref 5.0–8.0)

## 2023-01-26 LAB — LIPASE, BLOOD: Lipase: 31 U/L (ref 11–51)

## 2023-01-26 MED ORDER — ONDANSETRON 4 MG PO TBDP: ORAL_TABLET | ORAL | 0 refills | Status: AC

## 2023-01-26 MED ORDER — IOHEXOL 300 MG/ML  SOLN
100.0000 mL | Freq: Once | INTRAMUSCULAR | Status: AC | PRN
  Administered 2023-01-26: 100 mL via INTRAVENOUS

## 2023-01-26 MED ORDER — OXYCODONE-ACETAMINOPHEN 5-325 MG PO TABS
2.0000 | ORAL_TABLET | Freq: Once | ORAL | Status: AC
  Administered 2023-01-26: 2 via ORAL
  Filled 2023-01-26: qty 2

## 2023-01-26 MED ORDER — OXYCODONE-ACETAMINOPHEN 5-325 MG PO TABS: 2.0000 | ORAL_TABLET | Freq: Four times a day (QID) | ORAL | 0 refills | Status: DC | PRN

## 2023-01-26 MED ORDER — MORPHINE SULFATE (PF) 4 MG/ML IV SOLN
4.0000 mg | Freq: Once | INTRAVENOUS | Status: AC
  Administered 2023-01-26: 4 mg via INTRAVENOUS
  Filled 2023-01-26: qty 1

## 2023-01-26 MED ORDER — KETOROLAC TROMETHAMINE 30 MG/ML IJ SOLN
15.0000 mg | Freq: Once | INTRAMUSCULAR | Status: AC
  Administered 2023-01-26: 15 mg via INTRAVENOUS
  Filled 2023-01-26: qty 1

## 2023-01-26 MED ORDER — ONDANSETRON HCL 4 MG/2ML IJ SOLN
4.0000 mg | INTRAMUSCULAR | Status: AC
  Administered 2023-01-26: 4 mg via INTRAVENOUS
  Filled 2023-01-26: qty 2

## 2023-01-26 NOTE — ED Triage Notes (Signed)
Pt presents ambulatory to triage via POV with complaints of lower abdominal pain that started yesterday. Pt has tried OTC antiacids without improvement. Endorses 2 episodes of emesis ~ 2 hour ago due to the pain. Rates the pain 10/10 without radiation. Describes the pain as "sharp/cramps".  A&Ox4 at this time. Denies CP or SOB.

## 2023-01-26 NOTE — ED Provider Notes (Signed)
[facility] Provider Note    Event Date/Time   First MD Initiated Contact with Patient 01/26/23 0202     (approximate)   History   Abdominal Pain   HPI Catherine Mcbride is a 62 y.o. female who presents for evaluation of acute onset and severe generalized abdominal pain.  Symptoms started a few hours ago and have not gotten better.  She has nausea and has had 2 episodes of vomiting.  It is constant and seems to wax and wane in intensity.  She has never felt anything like this before.  She has not had any alcohol today but she usually drinks about 3 beers a night.  No known history of gallbladder disease, pancreatitis, or any significant prior abdominal surgery other than a hysterectomy years ago.  No chest pain or shortness of breath, no history of heart disease.  No drug use.     Physical Exam   Triage Vital Signs: ED Triage Vitals  Enc Vitals Group     BP 01/26/23 0054 (!) 141/81     Pulse Rate 01/26/23 0054 78     Resp 01/26/23 0054 18     Temp 01/26/23 0054 97.9 F (36.6 C)     Temp Source 01/26/23 0054 Oral     SpO2 01/26/23 0054 99 %     Weight 01/26/23 0052 63.5 kg (140 lb)     Height 01/26/23 0052 1.549 m (5\' 1" )     Head Circumference --      Peak Flow --      Pain Score 01/26/23 0051 10     Pain Loc --      Pain Edu? --      Excl. in [place]? --     Most recent vital signs: Vitals:   01/26/23 0415 01/26/23 0530  BP:  (!) 140/84  Pulse: 88 75  Resp:  17  Temp:    SpO2: 97% 93%    General: Awake, moaning and cannot find a position of comfort. CV:  Good peripheral perfusion.  Regular rate and rhythm. Resp:  Normal effort. Speaking easily and comfortably, no accessory muscle usage nor intercostal retractions.   Abd:  No distention.  Soft but with generalized tenderness to palpation all throughout the periumbilical region and lower abdomen.  No tenderness to palpation of the epigastrium nor right upper quadrant with no rebound, guarding,  nor Murphy sign   ED Results / Procedures / Treatments   Labs (all labs ordered are listed, but only abnormal results are displayed) Labs Reviewed  COMPREHENSIVE METABOLIC PANEL - Abnormal; Notable for the following components:      Result Value   Glucose, Bld 201 (*)    All other components within normal limits  CBC - Abnormal; Notable for the following components:   WBC 11.9 (*)    All other components within normal limits  URINALYSIS, ROUTINE W REFLEX MICROSCOPIC - Abnormal; Notable for the following components:   Color, Urine YELLOW (*)    APPearance CLEAR (*)    Specific Gravity, Urine >1.046 (*)    All other components within normal limits  LIPASE, BLOOD      RADIOLOGY I viewed and interpreted the patient's CT of the abdomen and pelvis.  I see no evidence of SBO or ileus, diverticulitis, or appendicitis to explain the patient's pain.  Radiology report indicates that the patient has a 9 mm infrarenal stone on the left but no evidence of obstructive uropathy or ureteral stones.  no acute issues identified.   PROCEDURES:  Critical Care performed: No  Procedures    IMPRESSION / MDM / ASSESSMENT AND PLAN / ED COURSE  I reviewed the triage vital signs and the nursing notes.                              Differential diagnosis includes, but is not limited to, biliary colic, renal/ureteral colic, SBO/ileus, pancreatitis, gastritis, diverticulitis.  Patient's presentation is most consistent with acute presentation with potential threat to life or bodily function.  Labs/studies ordered: CMP, lipase, CBC, urinalysis, CT abdomen/pelvis with IV contrast  Interventions/Medications given:  Medications  morphine (PF) 4 MG/ML injection 4 mg (4 mg Intravenous Given 01/26/23 0229)  ondansetron (ZOFRAN) injection 4 mg (4 mg Intravenous Given 01/26/23 0229)  ketorolac (TORADOL) 30 MG/ML injection 15 mg (15 mg Intravenous Given 01/26/23 0229)  iohexol (OMNIPAQUE) 300 MG/ML  solution 100 mL (100 mLs Intravenous Contrast Given 01/26/23 0249)  oxyCODONE-acetaminophen (PERCOCET/ROXICET) 5-325 MG per tablet 2 tablet (2 tablets Oral Given 01/26/23 0536)    (Note:  hospital course my include additional interventions and/or labs/studies not listed above.)   Labs are essentially normal other than a very mild leukocytosis.  No LFT elevation, no elevated lipase level.  Suspect lower abdominal issue, quite possibly ureteral colic based on the nature of the patient's pain and inability to find a position of comfort.  I think diverticulitis, appendicitis, acute intra-abdominal infection in general is less likely.  CT pending.  Medications as listed above.  Patient and husband agree with the plan.     Clinical Course as of 01/26/23 0539  Fri Jan 26, 2023  [id number] Patient is feeling much better, ambulatory, says she still feels a little bit of cramping pain in her abdomen but no acute pain at this time.  I provided the reassuring results and explained about the infrarenal stone but how I am not certain that this is causing the pain.  She will follow-up with her PCP.  I checked the [place] controlled substance database and I see no indication of any concerning prescribing habits.  I wrote a prescription for a few Percocet encouraged outpatient follow-up with her PCP as well as with urology as needed given the presence of the renal stone.  I gave my usual and customary return precautions. [CF]    Clinical Course User Index [CF] Loleta Rose, MD     FINAL CLINICAL IMPRESSION(S) / ED DIAGNOSES   Final diagnoses:  Generalized abdominal pain  Kidney stone     Rx / DC Orders   ED Discharge Orders          Ordered    oxyCODONE-acetaminophen (PERCOCET) 5-325 MG tablet  Every 6 hours PRN        01/26/23 0537    ondansetron (ZOFRAN-ODT) 4 MG disintegrating tablet        01/26/23 0537             Note:  This document was prepared using Dragon voice recognition  software and may include unintentional dictation errors.   Loleta Rose, MD 01/26/23 [phone]

## 2023-01-26 NOTE — Discharge Instructions (Addendum)
You have been seen in the Emergency Department (ED) for abdominal pain.  Your evaluation did not identify a clear cause of your symptoms but was generally reassuring.  It may be the result of a kidney stone either present in your left kidney or 1 that you already passed, but patient evaluation was reassuring with no evidence of any other acute infection or injury.  Please follow up as instructed above regarding today's emergent visit and the symptoms that are bothering you.  Take Percocet as prescribed for severe pain. Do not drink alcohol, drive or participate in any other potentially dangerous activities while taking this medication as it may make you sleepy. Do not take this medication with any other sedating medications, either prescription or over-the-counter. If you were prescribed Percocet or Vicodin, do not take these with acetaminophen (Tylenol) as it is already contained within these medications.   This medication is an opiate (or narcotic) pain medication and can be habit forming.  Use it as little as possible to achieve adequate pain control.  Do not use or use it with extreme caution if you have a history of opiate abuse or dependence.  If you are on a pain contract with your primary care doctor or a pain specialist, be sure to let them know you were prescribed this medication today from the [facility] Emergency Department.  This medication is intended for your use only - do not give any to anyone else and keep it in a secure place where nobody else, especially children, have access to it.  It will also cause or worsen constipation, so you may want to consider taking an over-the-counter stool softener while you are taking this medication.   Return to the ED if your abdominal pain worsens or fails to improve, you develop bloody vomiting, bloody diarrhea, you are unable to tolerate fluids due to vomiting, fever greater than 101, or other symptoms that concern you.

## 2023-01-28 ENCOUNTER — Inpatient Hospital Stay
Admission: EM | Admit: 2023-01-28 | Discharge: 2023-01-30 | DRG: 381 | Disposition: A | Discharge status: Home / Self Care | Payer: BC Managed Care – PPO | Attending: Hospitalist | Admitting: Hospitalist

## 2023-01-28 ENCOUNTER — Emergency Department: Payer: BC Managed Care – PPO

## 2023-01-28 ENCOUNTER — Other Ambulatory Visit: Payer: Self-pay

## 2023-01-28 DIAGNOSIS — E876 Hypokalemia: Secondary | ICD-10-CM | POA: Diagnosis present

## 2023-01-28 DIAGNOSIS — E861 Hypovolemia: Secondary | ICD-10-CM | POA: Diagnosis present

## 2023-01-28 DIAGNOSIS — E785 Hyperlipidemia, unspecified: Secondary | ICD-10-CM | POA: Diagnosis present

## 2023-01-28 DIAGNOSIS — Z79899 Other long term (current) drug therapy: Secondary | ICD-10-CM

## 2023-01-28 DIAGNOSIS — E860 Dehydration: Secondary | ICD-10-CM | POA: Diagnosis present

## 2023-01-28 DIAGNOSIS — J449 Chronic obstructive pulmonary disease, unspecified: Secondary | ICD-10-CM | POA: Diagnosis present

## 2023-01-28 DIAGNOSIS — R112 Nausea with vomiting, unspecified: Secondary | ICD-10-CM | POA: Diagnosis not present

## 2023-01-28 DIAGNOSIS — Z791 Long term (current) use of non-steroidal anti-inflammatories (NSAID): Secondary | ICD-10-CM

## 2023-01-28 DIAGNOSIS — F17210 Nicotine dependence, cigarettes, uncomplicated: Secondary | ICD-10-CM | POA: Diagnosis present

## 2023-01-28 DIAGNOSIS — N179 Acute kidney failure, unspecified: Secondary | ICD-10-CM | POA: Diagnosis present

## 2023-01-28 DIAGNOSIS — K760 Fatty (change of) liver, not elsewhere classified: Secondary | ICD-10-CM | POA: Diagnosis present

## 2023-01-28 DIAGNOSIS — R1013 Epigastric pain: Secondary | ICD-10-CM | POA: Diagnosis present

## 2023-01-28 DIAGNOSIS — R739 Hyperglycemia, unspecified: Secondary | ICD-10-CM | POA: Diagnosis present

## 2023-01-28 DIAGNOSIS — E871 Hypo-osmolality and hyponatremia: Secondary | ICD-10-CM | POA: Diagnosis present

## 2023-01-28 DIAGNOSIS — K529 Noninfective gastroenteritis and colitis, unspecified: Secondary | ICD-10-CM | POA: Diagnosis present

## 2023-01-28 DIAGNOSIS — I959 Hypotension, unspecified: Secondary | ICD-10-CM | POA: Diagnosis present

## 2023-01-28 DIAGNOSIS — I10 Essential (primary) hypertension: Secondary | ICD-10-CM | POA: Diagnosis present

## 2023-01-28 DIAGNOSIS — F1010 Alcohol abuse, uncomplicated: Secondary | ICD-10-CM | POA: Diagnosis present

## 2023-01-28 DIAGNOSIS — K315 Obstruction of duodenum: Principal | ICD-10-CM | POA: Diagnosis present

## 2023-01-28 DIAGNOSIS — I2510 Atherosclerotic heart disease of native coronary artery without angina pectoris: Secondary | ICD-10-CM | POA: Diagnosis present

## 2023-01-28 LAB — COMPREHENSIVE METABOLIC PANEL
ALT: 16 U/L (ref 0–44)
AST: 25 U/L (ref 15–41)
Albumin: 3.7 g/dL (ref 3.5–5.0)
Alkaline Phosphatase: 55 U/L (ref 38–126)
Anion gap: 8 (ref 5–15)
BUN: 37 mg/dL — ABNORMAL HIGH (ref 8–23)
CO2: 28 mmol/L (ref 22–32)
Calcium: 8.6 mg/dL — ABNORMAL LOW (ref 8.9–10.3)
Chloride: 93 mmol/L — ABNORMAL LOW (ref 98–111)
Creatinine, Ser: 1.8 mg/dL — ABNORMAL HIGH (ref 0.44–1.00)
GFR, Estimated: 32 mL/min — ABNORMAL LOW (ref >60)
Glucose, Bld: 158 mg/dL — ABNORMAL HIGH (ref 70–99)
Potassium: 3.2 mmol/L — ABNORMAL LOW (ref 3.5–5.1)
Sodium: 131 mmol/L — ABNORMAL LOW (ref 135–145)
Total Bilirubin: 1 mg/dL (ref 0.3–1.2)
Total Protein: 6.7 g/dL (ref 6.5–8.1)

## 2023-01-28 LAB — URINALYSIS, ROUTINE W REFLEX MICROSCOPIC
Bilirubin Urine: NEGATIVE
Glucose, UA: NEGATIVE mg/dL
Ketones, ur: NEGATIVE mg/dL
Leukocytes,Ua: NEGATIVE
Nitrite: NEGATIVE
Protein, ur: 30 mg/dL — AB
Specific Gravity, Urine: 1.013 (ref 1.005–1.030)
pH: 5 (ref 5.0–8.0)

## 2023-01-28 LAB — CBC
HCT: 39.6 % (ref 36.0–46.0)
Hemoglobin: 14.1 g/dL (ref 12.0–15.0)
MCH: 30.9 pg (ref 26.0–34.0)
MCHC: 35.6 g/dL (ref 30.0–36.0)
MCV: 86.7 fL (ref 80.0–100.0)
Platelets: 340 10*3/uL (ref 150–400)
RBC: 4.57 MIL/uL (ref 3.87–5.11)
RDW: 12.7 % (ref 11.5–15.5)
WBC: 14.4 10*3/uL — ABNORMAL HIGH (ref 4.0–10.5)
nRBC: 0 % (ref 0.0–0.2)

## 2023-01-28 LAB — LIPASE, BLOOD: Lipase: 30 U/L (ref 11–51)

## 2023-01-28 LAB — LACTIC ACID, PLASMA: Lactic Acid, Venous: 1.3 mmol/L (ref 0.5–1.9)

## 2023-01-28 LAB — TROPONIN I (HIGH SENSITIVITY): Troponin I (High Sensitivity): <2 ng/L (ref <18)

## 2023-01-28 MED ORDER — SODIUM CHLORIDE 0.9 % IV BOLUS
1000.0000 mL | Freq: Once | INTRAVENOUS | Status: AC
  Administered 2023-01-28: 1000 mL via INTRAVENOUS

## 2023-01-28 MED ORDER — IOHEXOL 350 MG/ML SOLN
55.0000 mL | Freq: Once | INTRAVENOUS | Status: AC | PRN
  Administered 2023-01-28: 55 mL via INTRAVENOUS

## 2023-01-28 MED ORDER — LACTATED RINGERS IV BOLUS
1000.0000 mL | Freq: Once | INTRAVENOUS | Status: AC
  Administered 2023-01-28: 1000 mL via INTRAVENOUS

## 2023-01-28 MED ORDER — PANTOPRAZOLE SODIUM 40 MG IV SOLR
40.0000 mg | Freq: Once | INTRAVENOUS | Status: AC
  Administered 2023-01-28: 40 mg via INTRAVENOUS
  Filled 2023-01-28: qty 10

## 2023-01-28 NOTE — ED Notes (Signed)
Dr. Fuller Plan aware of pt's BP--two litters of fluid running simultaneously on pt at this time. Family is at bedside.

## 2023-01-28 NOTE — H&P (Incomplete)
History and Physical  Catherine Mcbride:[id number] DOB: 1960/11/29 DOA: 01/28/2023  Referring physician: Dr. Fuller Plan, EDP  PCP: Marina Goodell, MD  Outpatient Specialists: None Patient coming from: Home.  Chief Complaint: Epigastric pain, nausea and vomiting.  HPI: Catherine Mcbride is a 62 y.o. female with medical history significant for essential hypertension, hyperlipidemia, impaired glucose tolerance, COPD, chronic NSAIDs use (BC powder for years) current tobacco and alcohol user, who presented to [facility] ED from home with complaints of severe epigastric pain for the past 3 days.  Associated with nausea and vomiting.  Seen in the ED 2 days ago with the same symptoms.  CT scan was done and the results were nonrevealing.  She returned today due to worsening symptoms.  States her pain was so severe that it was difficult to take some breaths.  Denies hematemesis, melena or hematochezia.  No bowel movement for the past 3 days.  She is afraid to eat due to her epigastric pain.  She presents back to the ED for further evaluation.  In the ED, noted to be profoundly hypotensive with SBP in the 70s.  She responded well to 3 L IV fluid boluses.  Lab studies were remarkable for leukocytosis WBC 14.4 K, creatinine 1.80 from 0.76, 2 days ago.  CTA chest/abdomen pelvis with and without contrast revealed the following findings:  1. Normal contour and caliber of the thoracic and abdominal aorta. No evidence of aneurysm, dissection, or other acute aortic pathology. Moderate mixed calcific atherosclerosis. 2. Air and fluid-filled, although nondistended loops of small bowel. Scattered, thickened appearing loops of small bowel, for example in the central abdomen. Findings are suggestive of nonspecific infectious or inflammatory enteritis. 3. Small volume ascites throughout the abdomen and pelvis. 4. Hepatic steatosis and coarse contour of the liver, suggestive of cirrhosis. 5. Mild diffuse bilateral  bronchial wall thickening and background of fine centrilobular pulmonary nodularity, consistent with smoking-related respiratory bronchiolitis. 6. Background of fine centrilobular pulmonary nodularity, most concentrated at the lung apices, consistent with smoking-related respiratory bronchiolitis. 7. Nonobstructive left nephrolithiasis. 8. Coronary artery disease.   Aortic Atherosclerosis (ICD10-I70.0).  EDP requested admission for further evaluation and management of her symptomatology.  Admitted by [facility], hospitalist service to telemetry medical unit as inpatient status.  GI consulted due to chronic NSAIDs use.  ED Course: Tmax 98.4.  BP 74/48, pulse 94, respiratory 18, saturation 94% on room air.  Lab studies remarkable for serum sodium 131, potassium 3.2, glucose 158, BUN 37, creatinine 1.80, GFR 32.  WBC 14.4, hemoglobin 14.1, platelet count 340.  Review of Systems: Review of systems as noted in the HPI. All other systems reviewed and are negative.  UA negative for pyuria.   Past Medical History:  Diagnosis Date   Anxiety    Bronchitis    Depression    Hyperlipidemia    Hypertension    Past Surgical History:  Procedure Laterality Date   ABDOMINAL HYSTERECTOMY     COLONOSCOPY WITH PROPOFOL N/A 05/14/2015   Procedure: COLONOSCOPY WITH PROPOFOL;  Surgeon: Elnita Maxwell, MD;  Location: [facility] ENDOSCOPY;  Service: Endoscopy;  Laterality: N/A;   TUBAL LIGATION     WISDOM TOOTH EXTRACTION      Social History:  reports that she has been smoking. She has been smoking an average of 1 pack per day. She has never used smokeless tobacco. She reports current alcohol use. She reports that she does not use drugs.   No Known Allergies  Family History  Problem Relation  Age of Onset   Breast cancer Neg Hx       Prior to Admission medications   Medication Sig Start Date End Date Taking? Authorizing Provider  albuterol (VENTOLIN HFA) 108 (90 Base) MCG/ACT inhaler Inhale 1-2 puffs  into the lungs every 4 (four) hours as needed.   Yes [provider]  fluticasone (FLONASE) 50 MCG/ACT nasal spray Place 2 sprays into both nostrils daily.   Yes [provider]  gemfibrozil (LOPID) 600 MG tablet Take 600 mg by mouth 2 (two) times daily before a meal. 08/09/20  Yes [provider]  hydrochlorothiazide (HYDRODIURIL) 25 MG tablet Take 25 mg by mouth daily.   Yes [provider]  lisinopril (PRINIVIL,ZESTRIL) 30 MG tablet Take 30 mg by mouth daily.   Yes [provider]  ondansetron (ZOFRAN-ODT) 4 MG disintegrating tablet Allow 1-2 tablets to dissolve in your mouth every 8 hours as needed for nausea/vomiting 01/26/23  Yes Loleta Rose, MD  oxyCODONE-acetaminophen (PERCOCET) 5-325 MG tablet Take 2 tablets by mouth every 6 (six) hours as needed for severe pain. 01/26/23  Yes Loleta Rose, MD  Monte Fantasia INHUB 250-50 MCG/ACT AEPB Inhale 1 puff into the lungs in the morning and at bedtime.   Yes [provider]  methocarbamol (ROBAXIN) 500 MG tablet Take 1 tablet (500 mg total) by mouth 2 (two) times daily. Patient not taking: Reported on 01/28/2023 12/23/20   Delton See, MD  predniSONE (STERAPRED UNI-PAK 21 TAB) 10 MG (21) TBPK tablet Take by mouth daily. Take 6 tabs by mouth daily  for 2 days, then 5 tabs for 2 days, then 4 tabs for 2 days, then 3 tabs for 2 days, 2 tabs for 2 days, then 1 tab by mouth daily for 2 days Patient not taking: Reported on 01/28/2023 12/23/20   Delton See, MD    Physical Exam: BP (!) 93/53   Pulse 67   Temp 98.4 F (36.9 C) (Oral)   Resp 16   Ht 5\' 1"  (1.549 m)   Wt 63.5 kg   SpO2 94%   BMI 26.45 kg/m   General: 62 y.o. year-old female well developed well nourished in no acute distress.  Alert and oriented x3. Cardiovascular: Regular rate and rhythm with no rubs or gallops.  No thyromegaly or JVD noted.  No lower extremity edema. 2/4 pulses in all 4 extremities. Respiratory: Clear to  auscultation with no wheezes or rales. Good inspiratory effort. Abdomen: Soft epigastric tenderness.  Nondistended with normal bowel sounds x4 quadrants. Muskuloskeletal: No cyanosis, clubbing or edema noted bilaterally Neuro: CN II-XII intact, strength, sensation, reflexes Skin: No ulcerative lesions noted or rashes Psychiatry: Judgement and insight appear normal. Mood is appropriate for condition and setting          Labs on Admission:  Basic Metabolic Panel: Recent Labs  Lab 01/26/23 0054 01/28/23 2057  NA 137 131*  K 3.7 3.2*  CL 100 93*  CO2 24 28  GLUCOSE 201* 158*  BUN 17 37*  CREATININE 0.76 1.80*  CALCIUM 9.3 8.6*   Liver Function Tests: Recent Labs  Lab 01/26/23 0054 01/28/23 2057  AST 30 25  ALT 24 16  ALKPHOS 70 55  BILITOT 0.8 1.0  PROT 7.6 6.7  ALBUMIN 4.1 3.7   Recent Labs  Lab 01/26/23 0054 01/28/23 2057  LIPASE 31 30   No results for input(s): "AMMONIA" in the last 168 hours. CBC: Recent Labs  Lab 01/26/23 0054 01/28/23 2057  WBC 11.9* 14.4*  HGB 13.6 14.1  HCT 38.1 39.6  MCV 88.2 86.7  PLT 284 340   Cardiac Enzymes: No results for input(s): "CKTOTAL", "CKMB", "CKMBINDEX", "TROPONINI" in the last 168 hours.  BNP (last 3 results) No results for input(s): "BNP" in the last 8760 hours.  ProBNP (last 3 results) No results for input(s): "PROBNP" in the last 8760 hours.  CBG: No results for input(s): "GLUCAP" in the last 168 hours.  Radiological Exams on Admission: CT Angio Chest/Abd/Pel for Dissection W and/or Wo Contrast  Result Date: 01/28/2023 CLINICAL DATA:  Acute aortic syndrome suspected EXAM: CT ANGIOGRAPHY CHEST, ABDOMEN AND PELVIS TECHNIQUE: Non-contrast CT of the chest was initially obtained. Multidetector CT imaging through the chest, abdomen and pelvis was performed using the standard protocol during bolus administration of intravenous contrast. Multiplanar reconstructed images and MIPs were obtained and reviewed to evaluate  the vascular anatomy. RADIATION DOSE REDUCTION: This exam was performed according to the departmental dose-optimization program which includes automated exposure control, adjustment of the mA and/or kV according to patient size and/or use of iterative reconstruction technique. CONTRAST:  55mL OMNIPAQUE IOHEXOL 350 MG/ML SOLN COMPARISON:  CT abdomen pelvis, 01/26/2023 FINDINGS: CTA CHEST FINDINGS VASCULAR Aorta: Satisfactory opacification of the aorta. Normal contour and caliber of the thoracic aorta. No evidence of aneurysm, dissection, or other acute aortic pathology. Mild mixed aortic atherosclerosis Cardiovascular: No evidence of pulmonary embolism on limited non-tailored examination. Normal heart size. Scattered left coronary artery calcifications. No pericardial effusion. Review of the MIP images confirms the above findings. NON VASCULAR Mediastinum/Nodes: No enlarged mediastinal, hilar, or axillary lymph nodes. Thyroid gland, trachea, and esophagus demonstrate no significant findings. Lungs/Pleura: Mild diffuse bilateral bronchial wall thickening. Background of fine centrilobular pulmonary nodularity, most concentrated at the lung apices. No pleural effusion or pneumothorax. Musculoskeletal: No chest wall abnormality. No acute osseous findings. Review of the MIP images confirms the above findings. CTA ABDOMEN AND PELVIS FINDINGS VASCULAR Normal contour and caliber of the abdominal aorta. No evidence of aneurysm, dissection, or other acute aortic pathology. Standard branching pattern of the abdominal aorta with solitary bilateral renal arteries. Moderate mixed calcific atherosclerosis. Review of the MIP images confirms the above findings. NON-VASCULAR Hepatobiliary: No solid liver abnormality is seen. Coarse contour of the liver. Hepatic steatosis. No gallstones, gallbladder wall thickening, or biliary dilatation. Pancreas: Unremarkable. No pancreatic ductal dilatation or surrounding inflammatory changes.  Spleen: Normal in size without significant abnormality. Adrenals/Urinary Tract: Adrenal glands are unremarkable. Nonobstructive left nephrolithiasis. No right-sided calculi, ureteral calculi, or hydronephrosis. Bladder is unremarkable. Stomach/Bowel: Stomach is within normal limits. Air and fluid-filled, although nondistended loops of small bowel (series 7, image 27). Scattered, thickened appearing loops of small bowel, for example in the central abdomen (series 7, image 23). Lymphatic: No enlarged abdominal or pelvic lymph nodes. Reproductive: Status post hysterectomy. Other: No abdominal wall hernia or abnormality. Small volume ascites throughout the abdomen and pelvis. Musculoskeletal: No acute osseous findings. IMPRESSION: 1. Normal contour and caliber of the thoracic and abdominal aorta. No evidence of aneurysm, dissection, or other acute aortic pathology. Moderate mixed calcific atherosclerosis. 2. Air and fluid-filled, although nondistended loops of small bowel. Scattered, thickened appearing loops of small bowel, for example in the central abdomen. Findings are suggestive of nonspecific infectious or inflammatory enteritis. 3. Small volume ascites throughout the abdomen and pelvis. 4. Hepatic steatosis and coarse contour of the liver, suggestive of cirrhosis. 5. Mild diffuse bilateral bronchial wall thickening and background of fine centrilobular pulmonary nodularity, consistent with smoking-related respiratory bronchiolitis. 6.  Background of fine centrilobular pulmonary nodularity, most concentrated at the lung apices, consistent with smoking-related respiratory bronchiolitis. 7. Nonobstructive left nephrolithiasis. 8. Coronary artery disease. Aortic Atherosclerosis (ICD10-I70.0). Electronically Signed   By: Jearld Lesch M.D.   On: 01/28/2023 22:18   US ABDOMEN LIMITED RUQ (LIVER/GB)  Result Date: 01/28/2023 CLINICAL DATA:  Epigastric abdominal pain EXAM: ULTRASOUND ABDOMEN LIMITED RIGHT UPPER QUADRANT  COMPARISON:  CT abdomen and pelvis 01/26/2023 FINDINGS: Gallbladder: No gallstones or wall thickening visualized. No sonographic Murphy sign noted by sonographer. Common bile duct: Diameter: 6.9 mm Liver: No focal lesion identified. Increase in parenchymal echogenicity. Portal vein is patent on color Doppler imaging with normal direction of blood flow towards the liver. Other: Small amount of ascites in the right upper quadrant. IMPRESSION: 1. The common bile duct is mildly dilated measuring 6.9 mm. Recommend correlation with LFTs. 2. Small amount of ascites in the right upper quadrant. 3. Increased echogenicity in the liver is nonspecific but often due to hepatic steatosis. Electronically Signed   By: Darliss Cheney M.D.   On: 01/28/2023 22:11    EKG: I independently viewed the EKG done and my findings are as followed: Sinus rhythm rate of 79.  Nonspecific ST-T changes.  QTc 475.  Assessment/Plan Present on Admission:  Intractable nausea and vomiting  Epigastric pain  Principal Problem:   Intractable nausea and vomiting Active Problems:   Epigastric pain  Epigastric pain, nausea and vomiting, suspect secondary to enteritis, seen on CT scan, cannot rule out active infective process. Endorses history of chronic NSAID use, chronic alcohol and tobacco use. High risk for gastritis, started IV Protonix 40 mg twice daily until seen by GI. GI, Dr. Timothy Lasso, consulted. Continue IV fluid IV antiemetics as needed Start Rocephin and Flagyl until active infective process is ruled out. Follow peripheral blood cultures x 2 Lipase and LFTs normal. IV Protonix 40 mg twice daily. An UDS was ordered to rule out other causes of abdominal pain, nausea, and vomiting as we could see with THC use.  AKI, suspect prerenal in the setting of dehydration from GI losses, vomiting, poor oral intake Baseline creatinine appears to be 0.7 with GFR greater than 60 Presented with creatinine 1.80 with GFR of 32 Continue IV  fluid hydration Monitor urine output Avoid nephrotoxic agents, dehydration and hypotension Repeat BMP in the morning  Possible cirrhosis seen on CT scan Counseled on the importance of alcohol cessation The patient was receptive. Avoid hepatotoxic agents Monitor LFTs.  Hypovolemic hyponatremia Hypokalemia Repleted intravenously with NS KCl 40 mill equivalent at 50 cc/h x 2 days Repeat BMP in the morning Obtain magnesium level.  Hyperglycemia Presented with serum glucose 158 Obtain hemoglobin A1c Start insulin sliding scale  Hypotension/hypovolemia Responded well to IV fluid boluses Continue IV fluid hydration Closely monitor vital signs Maintain MAP greater than 65  Tobacco use disorder Tobacco cessation counseling done at bedside  Alcohol use disorder Alcohol cessation counseling done at bedside No evidence of alcohol withdrawal on exam No alcohol intake since Tuesday, 5 days ago, out of window for alcohol withdrawal.   DVT prophylaxis: Subcu Lovenox daily  Code Status: Full code  Family Communication: Updated her spouse at bedside.  Disposition Plan: Admitted to telemetry medical unit  Consults called: GI.  Admission status: Inpatient status.   Status is: The patient requires at least 2 midnights for further evaluation and treatment of present condition.    Darlin Drop MD [facility] Pager [phone]  If 7PM-7AM, please contact night-coverage [email or web address]  Password [email or web address]  01/29/2023, 2:51 AM

## 2023-01-28 NOTE — ED Triage Notes (Signed)
Pt reports generalized abdominal that is sharp and constant but waivers in intensity. Pt was seen here 2 days ago for same with no definite finding on CT. She does report some nausea but Zofran helps. Pt is hypotensive in triage.

## 2023-01-28 NOTE — ED Notes (Addendum)
MD explaining to pt about her status, another liter of fluid started per order

## 2023-01-28 NOTE — ED Provider Notes (Incomplete)
   [facility] Provider Note    Event Date/Time   First MD Initiated Contact with Patient 01/28/23 2104     (approximate)   History   Abdominal Pain   HPI  Catherine Mcbride is a 62 y.o. female who comes in with concerns for worsening abdominal pain.  I reviewed the records where she was seen on 4/26 had a CT scan done which found a 9 mm nonobstructing left renal calculus.   Physical Exam   Triage Vital Signs: ED Triage Vitals  Enc Vitals Group     BP 01/28/23 2052 (!) 74/48     Pulse Rate 01/28/23 2052 94     Resp 01/28/23 2052 18     Temp 01/28/23 2052 98.4 F (36.9 C)     Temp Source 01/28/23 2052 Oral     SpO2 01/28/23 2052 94 %     Weight 01/28/23 2055 140 lb (63.5 kg)     Height 01/28/23 2055 5\' 1"  (1.549 m)     Head Circumference --      Peak Flow --      Pain Score 01/28/23 2053 8     Pain Loc --      Pain Edu? --      Excl. in GC? --     Most recent vital signs: Vitals:   01/28/23 2052  BP: (!) 74/48  Pulse: 94  Resp: 18  Temp: 98.4 F (36.9 C)  SpO2: 94%     General: Awake, no distress.  CV:  Good peripheral perfusion.  Resp:  Normal effort.  Abd:  No distention.  Other:     ED Results / Procedures / Treatments   Labs (all labs ordered are listed, but only abnormal results are displayed) Labs Reviewed  LIPASE, BLOOD  COMPREHENSIVE METABOLIC PANEL  CBC  URINALYSIS, ROUTINE W REFLEX MICROSCOPIC     EKG  My interpretation of EKG:    RADIOLOGY I have reviewed the xray personally and agree with radiology read   PROCEDURES:  Critical Care performed: {CriticalCareYesNo:19197::"Yes, see critical care procedure note(s)","No"}  Procedures   MEDICATIONS ORDERED IN ED: Medications - No data to display   IMPRESSION / MDM / ASSESSMENT AND PLAN / ED COURSE  I reviewed the triage vital signs and the nursing notes.   Patient's presentation is most consistent with {EM COPA:27473}     The patient is on  the cardiac monitor to evaluate for evidence of arrhythmia and/or significant heart rate changes.      FINAL CLINICAL IMPRESSION(S) / ED DIAGNOSES   Final diagnoses:  None     Rx / DC Orders   ED Discharge Orders     None        Note:  This document was prepared using Dragon voice recognition software and may include unintentional dictation errors.

## 2023-01-29 ENCOUNTER — Encounter: Payer: Self-pay | Admitting: Internal Medicine

## 2023-01-29 DIAGNOSIS — J449 Chronic obstructive pulmonary disease, unspecified: Secondary | ICD-10-CM | POA: Diagnosis present

## 2023-01-29 DIAGNOSIS — E876 Hypokalemia: Secondary | ICD-10-CM | POA: Diagnosis present

## 2023-01-29 DIAGNOSIS — E860 Dehydration: Secondary | ICD-10-CM | POA: Diagnosis present

## 2023-01-29 DIAGNOSIS — R739 Hyperglycemia, unspecified: Secondary | ICD-10-CM | POA: Diagnosis present

## 2023-01-29 DIAGNOSIS — I10 Essential (primary) hypertension: Secondary | ICD-10-CM | POA: Diagnosis present

## 2023-01-29 DIAGNOSIS — E861 Hypovolemia: Secondary | ICD-10-CM | POA: Diagnosis present

## 2023-01-29 DIAGNOSIS — Z791 Long term (current) use of non-steroidal anti-inflammatories (NSAID): Secondary | ICD-10-CM | POA: Diagnosis not present

## 2023-01-29 DIAGNOSIS — R1013 Epigastric pain: Secondary | ICD-10-CM | POA: Diagnosis present

## 2023-01-29 DIAGNOSIS — I959 Hypotension, unspecified: Secondary | ICD-10-CM | POA: Diagnosis present

## 2023-01-29 DIAGNOSIS — R112 Nausea with vomiting, unspecified: Secondary | ICD-10-CM | POA: Diagnosis not present

## 2023-01-29 DIAGNOSIS — K529 Noninfective gastroenteritis and colitis, unspecified: Secondary | ICD-10-CM | POA: Diagnosis present

## 2023-01-29 DIAGNOSIS — K315 Obstruction of duodenum: Secondary | ICD-10-CM | POA: Diagnosis present

## 2023-01-29 DIAGNOSIS — N179 Acute kidney failure, unspecified: Secondary | ICD-10-CM | POA: Diagnosis present

## 2023-01-29 DIAGNOSIS — I2510 Atherosclerotic heart disease of native coronary artery without angina pectoris: Secondary | ICD-10-CM | POA: Diagnosis present

## 2023-01-29 DIAGNOSIS — F17210 Nicotine dependence, cigarettes, uncomplicated: Secondary | ICD-10-CM | POA: Diagnosis present

## 2023-01-29 DIAGNOSIS — F1010 Alcohol abuse, uncomplicated: Secondary | ICD-10-CM | POA: Diagnosis present

## 2023-01-29 DIAGNOSIS — Z79899 Other long term (current) drug therapy: Secondary | ICD-10-CM | POA: Diagnosis not present

## 2023-01-29 DIAGNOSIS — E785 Hyperlipidemia, unspecified: Secondary | ICD-10-CM | POA: Diagnosis present

## 2023-01-29 DIAGNOSIS — E871 Hypo-osmolality and hyponatremia: Secondary | ICD-10-CM | POA: Diagnosis present

## 2023-01-29 DIAGNOSIS — K760 Fatty (change of) liver, not elsewhere classified: Secondary | ICD-10-CM | POA: Diagnosis present

## 2023-01-29 LAB — CBC
HCT: 33.1 % — ABNORMAL LOW (ref 36.0–46.0)
Hemoglobin: 11.7 g/dL — ABNORMAL LOW (ref 12.0–15.0)
MCH: 31.4 pg (ref 26.0–34.0)
MCHC: 35.3 g/dL (ref 30.0–36.0)
MCV: 88.7 fL (ref 80.0–100.0)
Platelets: 253 10*3/uL (ref 150–400)
RBC: 3.73 MIL/uL — ABNORMAL LOW (ref 3.87–5.11)
RDW: 12.7 % (ref 11.5–15.5)
WBC: 10.1 10*3/uL (ref 4.0–10.5)
nRBC: 0 % (ref 0.0–0.2)

## 2023-01-29 LAB — URINE DRUG SCREEN, QUALITATIVE (ARMC ONLY)
Amphetamines, Ur Screen: NOT DETECTED
Barbiturates, Ur Screen: NOT DETECTED
Benzodiazepine, Ur Scrn: NOT DETECTED
Cannabinoid 50 Ng, Ur ~~LOC~~: NOT DETECTED
Cocaine Metabolite,Ur ~~LOC~~: NOT DETECTED
MDMA (Ecstasy)Ur Screen: NOT DETECTED
Methadone Scn, Ur: NOT DETECTED
Opiate, Ur Screen: POSITIVE — AB
Phencyclidine (PCP) Ur S: NOT DETECTED
Tricyclic, Ur Screen: NOT DETECTED

## 2023-01-29 LAB — MAGNESIUM: Magnesium: 1.9 mg/dL (ref 1.7–2.4)

## 2023-01-29 LAB — HEMOGLOBIN A1C
Hgb A1c MFr Bld: 5.6 % (ref 4.8–5.6)
Mean Plasma Glucose: 114.02 mg/dL

## 2023-01-29 LAB — COMPREHENSIVE METABOLIC PANEL
ALT: 12 U/L (ref 0–44)
AST: 24 U/L (ref 15–41)
Albumin: 2.8 g/dL — ABNORMAL LOW (ref 3.5–5.0)
Alkaline Phosphatase: 42 U/L (ref 38–126)
Anion gap: 3 — ABNORMAL LOW (ref 5–15)
BUN: 27 mg/dL — ABNORMAL HIGH (ref 8–23)
CO2: 26 mmol/L (ref 22–32)
Calcium: 7.1 mg/dL — ABNORMAL LOW (ref 8.9–10.3)
Chloride: 106 mmol/L (ref 98–111)
Creatinine, Ser: 0.99 mg/dL (ref 0.44–1.00)
GFR, Estimated: >60 mL/min (ref >60)
Glucose, Bld: 111 mg/dL — ABNORMAL HIGH (ref 70–99)
Potassium: 6.2 mmol/L — ABNORMAL HIGH (ref 3.5–5.1)
Sodium: 135 mmol/L (ref 135–145)
Total Bilirubin: 0.6 mg/dL (ref 0.3–1.2)
Total Protein: 5 g/dL — ABNORMAL LOW (ref 6.5–8.1)

## 2023-01-29 LAB — PROCALCITONIN: Procalcitonin: <0.1 ng/mL

## 2023-01-29 LAB — POTASSIUM: Potassium: 3.5 mmol/L (ref 3.5–5.1)

## 2023-01-29 LAB — CBG MONITORING, ED: Glucose-Capillary: 123 mg/dL — ABNORMAL HIGH (ref 70–99)

## 2023-01-29 LAB — CULTURE, BLOOD (ROUTINE X 2)

## 2023-01-29 LAB — PHOSPHORUS: Phosphorus: 3 mg/dL (ref 2.5–4.6)

## 2023-01-29 LAB — HIV ANTIBODY (ROUTINE TESTING W REFLEX): HIV Screen 4th Generation wRfx: NONREACTIVE

## 2023-01-29 MED ORDER — HYDROMORPHONE HCL 1 MG/ML IJ SOLN: 0.5000 mg | INTRAMUSCULAR | Status: DC | PRN

## 2023-01-29 MED ORDER — SODIUM CHLORIDE 0.9 % IV SOLN
2.0000 g | INTRAVENOUS | Status: DC
  Administered 2023-01-29: 2 g via INTRAVENOUS
  Filled 2023-01-29 (×2): qty 20

## 2023-01-29 MED ORDER — POLYETHYLENE GLYCOL 3350 17 G PO PACK: 17.0000 g | PACK | Freq: Every day | ORAL | Status: DC | PRN

## 2023-01-29 MED ORDER — INSULIN ASPART 100 UNIT/ML IJ SOLN: 0.0000 [IU] | Freq: Three times a day (TID) | INTRAMUSCULAR | Status: DC

## 2023-01-29 MED ORDER — ENOXAPARIN SODIUM 40 MG/0.4ML IJ SOSY: 40.0000 mg | PREFILLED_SYRINGE | INTRAMUSCULAR | Status: DC

## 2023-01-29 MED ORDER — ONDANSETRON 4 MG PO TBDP: 4.0000 mg | ORAL_TABLET | Freq: Three times a day (TID) | ORAL | Status: DC | PRN

## 2023-01-29 MED ORDER — POTASSIUM CHLORIDE IN NACL 40-0.9 MEQ/L-% IV SOLN
INTRAVENOUS | Status: DC
  Filled 2023-01-29 (×3): qty 1000

## 2023-01-29 MED ORDER — LIDOCAINE VISCOUS HCL 2 % MT SOLN
15.0000 mL | Freq: Four times a day (QID) | OROMUCOSAL | Status: DC | PRN
  Filled 2023-01-29: qty 15

## 2023-01-29 MED ORDER — PANTOPRAZOLE SODIUM 40 MG IV SOLR: 40.0000 mg | INTRAVENOUS | Status: DC

## 2023-01-29 MED ORDER — ACETAMINOPHEN 325 MG PO TABS
650.0000 mg | ORAL_TABLET | Freq: Four times a day (QID) | ORAL | Status: DC | PRN
  Administered 2023-01-29 (×2): 650 mg via ORAL
  Filled 2023-01-29 (×2): qty 2

## 2023-01-29 MED ORDER — MELATONIN 5 MG PO TABS
5.0000 mg | ORAL_TABLET | Freq: Every evening | ORAL | Status: DC | PRN
  Administered 2023-01-29: 5 mg via ORAL
  Filled 2023-01-29: qty 1

## 2023-01-29 MED ORDER — ENOXAPARIN SODIUM 30 MG/0.3ML IJ SOSY
30.0000 mg | PREFILLED_SYRINGE | INTRAMUSCULAR | Status: DC
  Filled 2023-01-29: qty 0.3

## 2023-01-29 MED ORDER — INSULIN ASPART 100 UNIT/ML IJ SOLN: 0.0000 [IU] | Freq: Every day | INTRAMUSCULAR | Status: DC

## 2023-01-29 MED ORDER — PROCHLORPERAZINE EDISYLATE 10 MG/2ML IJ SOLN: 5.0000 mg | Freq: Four times a day (QID) | INTRAMUSCULAR | Status: DC | PRN

## 2023-01-29 MED ORDER — METRONIDAZOLE 500 MG/100ML IV SOLN
500.0000 mg | Freq: Two times a day (BID) | INTRAVENOUS | Status: DC
  Administered 2023-01-29 (×2): 500 mg via INTRAVENOUS
  Filled 2023-01-29 (×2): qty 100

## 2023-01-29 MED ORDER — OXYCODONE HCL 5 MG PO TABS
5.0000 mg | ORAL_TABLET | ORAL | Status: DC | PRN
  Administered 2023-01-29 (×2): 5 mg via ORAL
  Filled 2023-01-29 (×2): qty 1

## 2023-01-29 MED ORDER — PANTOPRAZOLE SODIUM 40 MG IV SOLR
40.0000 mg | Freq: Two times a day (BID) | INTRAVENOUS | Status: DC
  Administered 2023-01-29 – 2023-01-30 (×3): 40 mg via INTRAVENOUS
  Filled 2023-01-29 (×3): qty 10

## 2023-01-29 MED ORDER — ONDANSETRON HCL 4 MG/2ML IJ SOLN: 4.0000 mg | Freq: Four times a day (QID) | INTRAMUSCULAR | Status: DC | PRN

## 2023-01-29 NOTE — Consult Note (Addendum)
 GI Inpatient Consult Note  Reason for Consult: epigastric pain, N/V   Attending Requesting Consult: Dr. Hall  History of Present Illness: Catherine Mcbride is a 62 y.o. female seen for evaluation of epigastric pain, nausea, vomiting at the request of Dr. Hall. PMHx of essential hypertension, hyperlipidemia, impaired glucose tolerance, COPD, chronic NSAIDs use (BC powder for years), current tobacco and alcohol user. She presented to ED for epigastric pain, found to be hypotensive on ED admission w/ SBP in 70s, has improved w/ 3L bolus.   She reports her symptoms began on the evening of April 25, developed issues with abdominal pain, she tried Gas-X and anti-acids but developed severe pain and was seen in the emergency room.  She was discharged & reports pain continued and represented to the ER yesterday evening.  She endorses having pain worsening when she tried to eat.  She was taking Zofran at home which helped nausea. Denies any vomiting.  She denies frequent heartburn reflux symptoms and only takes Tums occasionally if needed.  She denies any history of similar epigastric pain.  She generally has regular bowel movements daily but has not had a bowel movement over the past few days due to limited oral intake.  She denies any rectal bleeding, melena. No lower abd pain.  She reports she smokes about 1 pack/day.  She does take 1 BC daily, has had higher intake in the past.  She does drink 3-4 beers per day for many years, has not had any alcohol since symptoms began on 01/25/23 . Catherine Mcbride.  Last Colonoscopy: 2016-non thrombosed external hemorrhoids, 2mm polyp sigmoid, 2mm polyp rectum. Path w/ hyperplastic polyps.  Last Endoscopy: none prior   Past Medical History:  Past Medical History:  Diagnosis Date   Anxiety    Bronchitis    Depression    Hyperlipidemia    Hypertension     Problem List: Patient Active Problem List   Diagnosis Date Noted   Epigastric pain 01/29/2023    Intractable nausea and vomiting 01/28/2023    Past Surgical History: Past Surgical History:  Procedure Laterality Date   ABDOMINAL HYSTERECTOMY     COLONOSCOPY WITH PROPOFOL N/A 05/14/2015   Procedure: COLONOSCOPY WITH PROPOFOL;  Surgeon: Matthew Gordon Rein, MD;  Location: [facility] ENDOSCOPY;  Service: Endoscopy;  Laterality: N/A;   TUBAL LIGATION     WISDOM TOOTH EXTRACTION      Allergies: No Known Allergies  Home Medications: (Not in a hospital admission)  Home medication reconciliation was completed with the patient.   Scheduled Inpatient Medications:    enoxaparin (LOVENOX) injection  30 mg Subcutaneous Q24H   insulin aspart  0-5 Units Subcutaneous QHS   insulin aspart  0-9 Units Subcutaneous TID WC   pantoprazole (PROTONIX) IV  40 mg Intravenous BID    Continuous Inpatient Infusions:    0.9 % NaCl with KCl 40 mEq / L 50 mL/hr at 01/29/23 0120   cefTRIAXone (ROCEPHIN)  IV Stopped (01/29/23 0149)   metronidazole Stopped (01/29/23 0211)    PRN Inpatient Medications:  acetaminophen, HYDROmorphone (DILAUDID) injection, melatonin, oxyCODONE, polyethylene glycol, prochlorperazine  Family History: family history is not on file.    Social History:   reports that she has been smoking. She has been smoking an average of 1 pack per day. She has never used smokeless tobacco. She reports current alcohol use. She reports that she does not use drugs.   Review of Systems: Constitutional: Weight is stable.  Eyes: No changes in   vision. ENT: No oral lesions, sore throat.  GI: see HPI.  Heme/Lymph: No easy bruising.  CV: No chest pain.  GU: No hematuria.  Integumentary: No rashes.  Neuro: No headaches.  Psych: No depression/anxiety.  Endocrine: No heat/cold intolerance.  Allergic/Immunologic: No urticaria.  Resp: No cough, SOB.  Musculoskeletal: No joint swelling.    Physical Examination: BP 112/70   Pulse 81   Temp 98.4 F (36.9 C)   Resp 17   Ht 5' 1" (1.549 m)   Wt  63.5 kg   SpO2 94%   BMI 26.45 kg/m  Gen: NAD, alert and oriented x 4 HEENT: PEERLA, EOMI, Neck: supple, no JVD or thyromegaly Chest: CTA bilaterally, no wheezes, crackles, or other adventitious sounds CV: RRR, no m/g/c/r Abd: soft, +TTP epigastric and periumbilical area, ND, +BS in all four quadrants; no HSM, guarding, ridigity, or rebound tenderness Ext: no edema, well perfused with 2+ pulses, Skin: no rash or lesions noted  Data: Lab Results  Component Value Date   WBC 10.1 01/29/2023   HGB 11.7 (L) 01/29/2023   HCT 33.1 (L) 01/29/2023   MCV 88.7 01/29/2023   PLT 253 01/29/2023   Recent Labs  Lab 01/26/23 0054 01/28/23 2057 01/29/23 0415  HGB 13.6 14.1 11.7*   Lab Results  Component Value Date   NA 135 01/29/2023   K 6.2 (H) 01/29/2023   CL 106 01/29/2023   CO2 26 01/29/2023   BUN 27 (H) 01/29/2023   CREATININE 0.99 01/29/2023   Lab Results  Component Value Date   ALT 12 01/29/2023   AST 24 01/29/2023   ALKPHOS 42 01/29/2023   BILITOT 0.6 01/29/2023   No results for input(s): "APTT", "INR", "PTT" in the last 168 hours.  CT a/p yesterday-IMPRESSION: 1. Normal contour and caliber of the thoracic and abdominal aorta. No evidence of aneurysm, dissection, or other acute aortic pathology. Moderate mixed calcific atherosclerosis. 2. Air and fluid-filled, although nondistended loops of small bowel. Scattered, thickened appearing loops of small bowel, for example in the central abdomen. Findings are suggestive of nonspecific infectious or inflammatory enteritis. 3. Small volume ascites throughout the abdomen and pelvis. 4. Hepatic steatosis and coarse contour of the liver, suggestive of cirrhosis. 5. Mild diffuse bilateral bronchial wall thickening and background of fine centrilobular pulmonary nodularity, consistent with smoking-related respiratory bronchiolitis. 6. Background of fine centrilobular pulmonary nodularity, most concentrated at the lung apices, consistent  with smoking-related respiratory bronchiolitis. 7. Nonobstructive left nephrolithiasis. 8. Coronary artery disease. Aortic Atherosclerosis (ICD10-I70.0  Assessment/Plan: Catherine Mcbride is a 62 y.o. female admitted for epigastric pain.   Epigastric pain - inflammatory vs. Infectious enteritis on CT a/p as above, suspect related to daily BCs, daily alcohol, tobacco dependence etc. On PPI BID and IV flagyl. Will plan for EGD tomorrow, clear liquids today, NPO at MN; no diarrhea, lipase wnl AKI - improving since admission, likely due to dehydration/hypotension Suspected Cirrhosis - coarsened echotexture on imaging, platelet/albumin normal on admission, check INR. Check viral hepatitis markers. Reports she does drink alcohol daily, 3-4 drinks per day, for many years.  She does seem interested in decreasing alcohol intake and we discussed the importance of this long-term.  She denies any IV drug use, nasal drug use, etc.  Will check initial labs as inpatient and follow-up as outpatient for further hepatology care. Hypokalemia on admission - primary team following lytes   Recommendations: EGD tomorrow  Clear liquid diet today NPO at MN Continue supportive care per primary team   Case   was discussed with Dr. Russo. Thank you for the consult. Please call with questions or concerns.  Hillery Bhalla B Manna Gose, PA-C [facility]  

## 2023-01-29 NOTE — H&P (View-Only) (Signed)
GI Inpatient Consult Note  Reason for Consult: epigastric pain, N/V   Attending Requesting Consult: Dr. Margo Mcbride  History of Present Illness: Catherine Mcbride is a 62 y.o. female seen for evaluation of epigastric pain, nausea, vomiting at the request of Dr. Margo Mcbride. PMHx of essential hypertension, hyperlipidemia, impaired glucose tolerance, COPD, chronic NSAIDs use (BC powder for years), current tobacco and alcohol user. She presented to ED for epigastric pain, found to be hypotensive on ED admission w/ SBP in 70s, has improved w/ 3L bolus.   She reports her symptoms began on the evening of April 25, developed issues with abdominal pain, she tried Gas-X and anti-acids but developed severe pain and was seen in the emergency room.  She was discharged & reports pain continued and represented to the ER yesterday evening.  She endorses having pain worsening when she tried to eat.  She was taking Zofran at home which helped nausea. Denies any vomiting.  She denies frequent heartburn reflux symptoms and only takes Tums occasionally if needed.  She denies any history of similar epigastric pain.  She generally has regular bowel movements daily but has not had a bowel movement over the past few days due to limited oral intake.  She denies any rectal bleeding, melena. No lower abd pain.  She reports she smokes about 1 pack/day.  She does take 1 BC daily, has had higher intake in the past.  She does drink 3-4 beers per day for many years, has not had any alcohol since symptoms began on 01/25/23 . Husband at bedside.  Last Colonoscopy: 2016-non thrombosed external hemorrhoids, 2mm polyp sigmoid, 2mm polyp rectum. Path w/ hyperplastic polyps.  Last Endoscopy: none prior   Past Medical History:  Past Medical History:  Diagnosis Date   Anxiety    Bronchitis    Depression    Hyperlipidemia    Hypertension     Problem List: Patient Active Problem List   Diagnosis Date Noted   Epigastric pain 01/29/2023    Intractable nausea and vomiting 01/28/2023    Past Surgical History: Past Surgical History:  Procedure Laterality Date   ABDOMINAL HYSTERECTOMY     COLONOSCOPY WITH PROPOFOL N/A 05/14/2015   Procedure: COLONOSCOPY WITH PROPOFOL;  Surgeon: Elnita Maxwell, MD;  Location: [facility] ENDOSCOPY;  Service: Endoscopy;  Laterality: N/A;   TUBAL LIGATION     WISDOM TOOTH EXTRACTION      Allergies: No Known Allergies  Home Medications: (Not in a hospital admission)  Home medication reconciliation was completed with the patient.   Scheduled Inpatient Medications:    enoxaparin (LOVENOX) injection  30 mg Subcutaneous Q24H   insulin aspart  0-5 Units Subcutaneous QHS   insulin aspart  0-9 Units Subcutaneous TID WC   pantoprazole (PROTONIX) IV  40 mg Intravenous BID    Continuous Inpatient Infusions:    0.9 % NaCl with KCl 40 mEq / L 50 mL/hr at 01/29/23 0120   cefTRIAXone (ROCEPHIN)  IV Stopped (01/29/23 0149)   metronidazole Stopped (01/29/23 0211)    PRN Inpatient Medications:  acetaminophen, HYDROmorphone (DILAUDID) injection, melatonin, oxyCODONE, polyethylene glycol, prochlorperazine  Family History: family history is not on file.    Social History:   reports that she has been smoking. She has been smoking an average of 1 pack per day. She has never used smokeless tobacco. She reports current alcohol use. She reports that she does not use drugs.   Review of Systems: Constitutional: Weight is stable.  Eyes: No changes in  vision. ENT: No oral lesions, sore throat.  GI: see HPI.  Heme/Lymph: No easy bruising.  CV: No chest pain.  GU: No hematuria.  Integumentary: No rashes.  Neuro: No headaches.  Psych: No depression/anxiety.  Endocrine: No heat/cold intolerance.  Allergic/Immunologic: No urticaria.  Resp: No cough, SOB.  Musculoskeletal: No joint swelling.    Physical Examination: BP 112/70   Pulse 81   Temp 98.4 F (36.9 C)   Resp 17   Ht 5\' 1"  (1.549 m)   Wt  63.5 kg   SpO2 94%   BMI 26.45 kg/m  Gen: NAD, alert and oriented x 4 HEENT: PEERLA, EOMI, Neck: supple, no JVD or thyromegaly Chest: CTA bilaterally, no wheezes, crackles, or other adventitious sounds CV: RRR, no m/g/c/r Abd: soft, +TTP epigastric and periumbilical area, ND, +BS in all four quadrants; no HSM, guarding, ridigity, or rebound tenderness Ext: no edema, well perfused with 2+ pulses, Skin: no rash or lesions noted  Data: Lab Results  Component Value Date   WBC 10.1 01/29/2023   HGB 11.7 (L) 01/29/2023   HCT 33.1 (L) 01/29/2023   MCV 88.7 01/29/2023   PLT 253 01/29/2023   Recent Labs  Lab 01/26/23 0054 01/28/23 2057 01/29/23 0415  HGB 13.6 14.1 11.7*   Lab Results  Component Value Date   NA 135 01/29/2023   K 6.2 (H) 01/29/2023   CL 106 01/29/2023   CO2 26 01/29/2023   BUN 27 (H) 01/29/2023   CREATININE 0.99 01/29/2023   Lab Results  Component Value Date   ALT 12 01/29/2023   AST 24 01/29/2023   ALKPHOS 42 01/29/2023   BILITOT 0.6 01/29/2023   No results for input(s): "APTT", "INR", "PTT" in the last 168 hours.  CT a/p yesterday-IMPRESSION: 1. Normal contour and caliber of the thoracic and abdominal aorta. No evidence of aneurysm, dissection, or other acute aortic pathology. Moderate mixed calcific atherosclerosis. 2. Air and fluid-filled, although nondistended loops of small bowel. Scattered, thickened appearing loops of small bowel, for example in the central abdomen. Findings are suggestive of nonspecific infectious or inflammatory enteritis. 3. Small volume ascites throughout the abdomen and pelvis. 4. Hepatic steatosis and coarse contour of the liver, suggestive of cirrhosis. 5. Mild diffuse bilateral bronchial wall thickening and background of fine centrilobular pulmonary nodularity, consistent with smoking-related respiratory bronchiolitis. 6. Background of fine centrilobular pulmonary nodularity, most concentrated at the lung apices, consistent  with smoking-related respiratory bronchiolitis. 7. Nonobstructive left nephrolithiasis. 8. Coronary artery disease. Aortic Atherosclerosis (ICD10-I70.0  Assessment/Plan: [name] is a 62 y.o. female admitted for epigastric pain.   Epigastric pain - inflammatory vs. Infectious enteritis on CT a/p as above, suspect related to daily BCs, daily alcohol, tobacco dependence etc. On PPI BID and IV flagyl. Will plan for EGD tomorrow, clear liquids today, NPO at MN; no diarrhea, lipase wnl AKI - improving since admission, likely due to dehydration/hypotension Suspected Cirrhosis - coarsened echotexture on imaging, platelet/albumin normal on admission, check INR. Check viral hepatitis markers. Reports she does drink alcohol daily, 3-4 drinks per day, for many years.  She does seem interested in decreasing alcohol intake and we discussed the importance of this long-term.  She denies any IV drug use, nasal drug use, etc.  Will check initial labs as inpatient and follow-up as outpatient for further hepatology care. Hypokalemia on admission - primary team following lytes   Recommendations: EGD tomorrow  Clear liquid diet today NPO at MN Continue supportive care per primary team   Case  was discussed with Dr. Timothy Lasso. Thank you for the consult. Please call with questions or concerns.  Ardelia Mems, PA-C [facility]

## 2023-01-29 NOTE — ED Provider Notes (Incomplete)
   [facility] Provider Note    Event Date/Time   First MD Initiated Contact with Patient 01/28/23 2104     (approximate)   History   Abdominal Pain   HPI  Catherine Mcbride is a 62 y.o. female who comes in with concerns for worsening abdominal pain.  I reviewed the records where she was seen on 4/26 had a CT scan done which found a 9 mm nonobstructing left renal calculus.  Patient reports that has not been able to eat or drink reports pain in the epigastric area.  Denies any chest pain, shortness of breath, falls hitting her head or any other concerns.  Just reports feeling very lightheaded   Physical Exam   Triage Vital Signs: ED Triage Vitals  Enc Vitals Group     BP 01/28/23 2052 (!) 74/48     Pulse Rate 01/28/23 2052 94     Resp 01/28/23 2052 18     Temp 01/28/23 2052 98.4 F (36.9 C)     Temp Source 01/28/23 2052 Oral     SpO2 01/28/23 2052 94 %     Weight 01/28/23 2055 140 lb (63.5 kg)     Height 01/28/23 2055 5\' 1"  (1.549 m)     Head Circumference --      Peak Flow --      Pain Score 01/28/23 2053 8     Pain Loc --      Pain Edu? --      Excl. in GC? --     Most recent vital signs: Vitals:   01/28/23 2052  BP: (!) 74/48  Pulse: 94  Resp: 18  Temp: 98.4 F (36.9 C)  SpO2: 94%     General: Awake, no distress.  CV:  Good peripheral perfusion.  Resp:  Normal effort.  Abd:  No distention.  Tenderness in the upper abdomen.  No rebound, no guarding Other:     ED Results / Procedures / Treatments   Labs (all labs ordered are listed, but only abnormal results are displayed) Labs Reviewed  LIPASE, BLOOD  COMPREHENSIVE METABOLIC PANEL  CBC  URINALYSIS, ROUTINE W REFLEX MICROSCOPIC     EKG  My interpretation of EKG:    RADIOLOGY I have reviewed the xray personally and agree with radiology read   PROCEDURES:  Critical Care performed: {CriticalCareYesNo:19197::"Yes, see critical care procedure  note(s)","No"}  Procedures   MEDICATIONS ORDERED IN ED: Medications - No data to display   IMPRESSION / MDM / ASSESSMENT AND PLAN / ED COURSE  I reviewed the triage vital signs and the nursing notes.   Patient's presentation is most consistent with {EM COPA:27473}     The patient is on the cardiac monitor to evaluate for evidence of arrhythmia and/or significant heart rate changes.      FINAL CLINICAL IMPRESSION(S) / ED DIAGNOSES   Final diagnoses:  None     Rx / DC Orders   ED Discharge Orders     None        Note:  This document was prepared using Dragon voice recognition software and may include unintentional dictation errors.

## 2023-01-29 NOTE — ED Notes (Signed)
Report given to Ashley RN

## 2023-01-29 NOTE — ED Notes (Signed)
MD Admit messaged regarding insulin orders.

## 2023-01-29 NOTE — ED Notes (Signed)
Pt given water at this time 

## 2023-01-29 NOTE — ED Notes (Signed)
Pt given breakfast tray and eating at this time. °

## 2023-01-29 NOTE — Progress Notes (Signed)
  PROGRESS NOTE    Catherine Mcbride  WUJ:[id number] DOB: 12-Oct-1960 DOA: 01/28/2023 PCP: Marina Goodell, MD  [id number]  LOS: 0 days   Brief hospital course:   Assessment & Plan: Catherine Mcbride is a 62 y.o. female with medical history significant for essential hypertension, hyperlipidemia, impaired glucose tolerance, COPD, chronic NSAIDs use (BC powder for years) current tobacco and alcohol user, who presented to [facility] ED from home with complaints of severe epigastric pain for the past 3 days.  Associated with nausea and vomiting.  Seen in the ED 2 days ago with the same symptoms.  CT scan was done and the results were nonrevealing.  She returned today due to worsening symptoms.  States her pain was so severe that it was difficult to take some breaths.    Epigastric pain --given hx of daily BCs, daily alcohol, likely due to gastritis.   Plan: --EGD tomorrow --cont IV PPI BID --clear liquid diet  nausea and vomiting  Enteritis --seen on CT scan, started on ceftriaxone and flagyl, however, no strong evidence of infection. --d/c ceftriaxone and flagyl --cont gentle MIVF --supportive care   AKI, suspect prerenal in the setting of dehydration from GI losses, vomiting, poor oral intake --Cr 1.8 on presentation.  Baseline 0.7 --cont gentle MIVF   Fatty liver Counseled on the importance of alcohol cessation The patient was receptive.   Hypovolemic hyponatremia --Na 131, improved with MIVF.   Hyperglycemia --not clinically significant.  A1c 5.6, not diabetic. --d/c BG checks and SSI   Hypotension/hypovolemia Responded well to IV fluid boluses Continue IV fluid hydration  Tobacco use disorder Tobacco cessation counseling done at bedside   Alcohol use disorder Alcohol cessation counseling done at bedside No evidence of alcohol withdrawal on exam No alcohol intake since Tuesday, 5 days ago, out of window for alcohol withdrawal.   DVT prophylaxis: Lovenox SQ Code  Status: Full code  Family Communication: husband updated at bedside today Level of care: Telemetry Medical Dispo:   The patient is from: home Anticipated d/c is to: home Anticipated d/c date is: tomorrow   Subjective and Interval History:  Pt continued to have epigastric pain.  No N/V/D.  BP improved.   Objective: Vitals:   01/29/23 1200 01/29/23 1250 01/29/23 1343 01/29/23 1635  BP: 116/70  122/67 (!) 110/58  Pulse: 86  87 91  Resp: 20  19 20   Temp:  98.3 F (36.8 C) 98.4 F (36.9 C) 98.2 F (36.8 C)  TempSrc:  Oral Oral   SpO2: 95%  97% 96%  Weight:      Height:       No intake or output data in the 24 hours ending 01/29/23 1752 Filed Weights   01/28/23 2055  Weight: 63.5 kg    Examination:   Constitutional: NAD, AAOx3 HEENT: conjunctivae and lids normal, EOMI CV: No cyanosis.   RESP: normal respiratory effort, on RA Neuro: II - XII grossly intact.   Psych: Normal mood and affect.  Appropriate judgement and reason   Data Reviewed: I have personally reviewed labs and imaging studies  Time spent: 50 minutes  Darlin Priestly, MD [facility] If 7PM-7AM, please contact night-coverage 01/29/2023, 5:52 PM

## 2023-01-29 NOTE — Progress Notes (Signed)
Anticoagulation monitoring(Lovenox):  62 yo  female ordered Lovenox 40 mg Q24h    Filed Weights   01/28/23 2055  Weight: 63.5 kg (140 lb)   BMI 26.5    Lab Results  Component Value Date   CREATININE 1.80 (H) 01/28/2023   CREATININE 0.76 01/26/2023   Estimated Creatinine Clearance: 28 mL/min (A) (by C-G formula based on SCr of 1.8 mg/dL (H)). Hemoglobin & Hematocrit     Component Value Date/Time   HGB 14.1 01/28/2023 2057   HCT 39.6 01/28/2023 2057     Per Protocol for Patient with estCrcl < 30 ml/min and BMI < 30, will transition to Lovenox 30 mg Q24h.

## 2023-01-30 ENCOUNTER — Encounter
Admission: EM | Disposition: A | Discharge status: Home / Self Care | Payer: Self-pay | Source: Home / Self Care | Attending: Hospitalist

## 2023-01-30 ENCOUNTER — Inpatient Hospital Stay: Payer: BC Managed Care – PPO | Admitting: Anesthesiology

## 2023-01-30 ENCOUNTER — Encounter: Payer: Self-pay | Admitting: Internal Medicine

## 2023-01-30 HISTORY — PX: ESOPHAGOGASTRODUODENOSCOPY (EGD) WITH PROPOFOL: SHX5813

## 2023-01-30 LAB — CBC
HCT: 31.2 % — ABNORMAL LOW (ref 36.0–46.0)
Hemoglobin: 11.1 g/dL — ABNORMAL LOW (ref 12.0–15.0)
MCH: 31.5 pg (ref 26.0–34.0)
MCHC: 35.6 g/dL (ref 30.0–36.0)
MCV: 88.6 fL (ref 80.0–100.0)
Platelets: 233 10*3/uL (ref 150–400)
RBC: 3.52 MIL/uL — ABNORMAL LOW (ref 3.87–5.11)
RDW: 12.7 % (ref 11.5–15.5)
WBC: 6.5 10*3/uL (ref 4.0–10.5)
nRBC: 0 % (ref 0.0–0.2)

## 2023-01-30 LAB — BASIC METABOLIC PANEL
Anion gap: 7 (ref 5–15)
BUN: 16 mg/dL (ref 8–23)
CO2: 26 mmol/L (ref 22–32)
Calcium: 8.1 mg/dL — ABNORMAL LOW (ref 8.9–10.3)
Chloride: 100 mmol/L (ref 98–111)
Creatinine, Ser: 0.64 mg/dL (ref 0.44–1.00)
GFR, Estimated: >60 mL/min (ref >60)
Glucose, Bld: 103 mg/dL — ABNORMAL HIGH (ref 70–99)
Potassium: 3.5 mmol/L (ref 3.5–5.1)
Sodium: 133 mmol/L — ABNORMAL LOW (ref 135–145)

## 2023-01-30 LAB — IRON AND TIBC
Iron: 25 ug/dL — ABNORMAL LOW (ref 28–170)
Saturation Ratios: 9 % — ABNORMAL LOW (ref 10.4–31.8)
TIBC: 267 ug/dL (ref 250–450)
UIBC: 242 ug/dL

## 2023-01-30 LAB — HEPATITIS A ANTIBODY, IGM: Hep A IgM: NONREACTIVE

## 2023-01-30 LAB — FERRITIN: Ferritin: 163 ng/mL (ref 11–307)

## 2023-01-30 LAB — CULTURE, BLOOD (ROUTINE X 2): Culture: NO GROWTH

## 2023-01-30 LAB — HEPATITIS A ANTIBODY, TOTAL: hep A Total Ab: NONREACTIVE

## 2023-01-30 LAB — HEPATITIS B SURFACE ANTIGEN: Hepatitis B Surface Ag: NONREACTIVE

## 2023-01-30 LAB — HEPATITIS B CORE ANTIBODY, TOTAL: Hep B Core Total Ab: NONREACTIVE

## 2023-01-30 LAB — HEPATITIS C ANTIBODY: HCV Ab: NONREACTIVE

## 2023-01-30 LAB — MAGNESIUM: Magnesium: 1.7 mg/dL (ref 1.7–2.4)

## 2023-01-30 SURGERY — ESOPHAGOGASTRODUODENOSCOPY (EGD) WITH PROPOFOL
Anesthesia: General

## 2023-01-30 MED ORDER — HYDROCHLOROTHIAZIDE 25 MG PO TABS: ORAL_TABLET | ORAL | Status: AC

## 2023-01-30 MED ORDER — PROPOFOL 500 MG/50ML IV EMUL
INTRAVENOUS | Status: DC | PRN
  Administered 2023-01-30: 120 ug/kg/min via INTRAVENOUS

## 2023-01-30 MED ORDER — LIDOCAINE 2% (20 MG/ML) 5 ML SYRINGE
INTRAMUSCULAR | Status: DC | PRN
  Administered 2023-01-30: 20 mg via INTRAVENOUS

## 2023-01-30 MED ORDER — MIDAZOLAM HCL 5 MG/5ML IJ SOLN
INTRAMUSCULAR | Status: DC | PRN
  Administered 2023-01-30: 2 mg via INTRAVENOUS

## 2023-01-30 MED ORDER — LISINOPRIL 30 MG PO TABS: ORAL_TABLET | ORAL | Status: AC

## 2023-01-30 MED ORDER — GLYCOPYRROLATE 0.2 MG/ML IJ SOLN
INTRAMUSCULAR | Status: DC | PRN
  Administered 2023-01-30: .2 mg via INTRAVENOUS

## 2023-01-30 MED ORDER — MIDAZOLAM HCL 2 MG/2ML IJ SOLN
INTRAMUSCULAR | Status: AC
  Filled 2023-01-30: qty 2

## 2023-01-30 MED ORDER — GLYCOPYRROLATE 0.2 MG/ML IJ SOLN
INTRAMUSCULAR | Status: AC
  Filled 2023-01-30: qty 1

## 2023-01-30 MED ORDER — OXYCODONE-ACETAMINOPHEN 5-325 MG PO TABS: 1.0000 | ORAL_TABLET | Freq: Three times a day (TID) | ORAL | 0 refills | Status: AC | PRN

## 2023-01-30 MED ORDER — PROPOFOL 10 MG/ML IV BOLUS
INTRAVENOUS | Status: DC | PRN
  Administered 2023-01-30: 100 mg via INTRAVENOUS

## 2023-01-30 MED ORDER — PANTOPRAZOLE SODIUM 40 MG PO TBEC: 40.0000 mg | DELAYED_RELEASE_TABLET | Freq: Two times a day (BID) | ORAL | 2 refills | Status: AC

## 2023-01-30 MED ORDER — SODIUM CHLORIDE 0.9 % IV SOLN: INTRAVENOUS | Status: DC

## 2023-01-30 NOTE — Op Note (Addendum)
[facility] Patient Name: Catherine Mcbride Procedure Date: 01/30/2023 2:05 PM MRN: [id number] Account #: [id number] Date of Birth: 1961-09-19 Admit Type: Inpatient Age: 62 Room: [facility] ENDO ROOM 1 Gender: Female Note Status: Supervisor Override Instrument Name: Patton Salles Endoscope [id number] Procedure:             Upper GI endoscopy Indications:           Epigastric abdominal pain Providers:             [name] DO, DO Medicines:             Monitored Anesthesia Care Complications:         No immediate complications. Estimated blood loss:                         Minimal. Procedure:             Pre-Anesthesia Assessment:                        - Prior to the procedure, a History and Physical was                         performed, and patient medications and allergies were                         reviewed. The patient is competent. The risks and                         benefits of the procedure and the sedation options and                         risks were discussed with the patient. All questions                         were answered and informed consent was obtained.                         Patient identification and proposed procedure were                         verified by the physician, the nurse, the anesthetist                         and the technician in the endoscopy suite. Mental                         Status Examination: alert and oriented. Airway                         Examination: normal oropharyngeal airway and neck                         mobility. Respiratory Examination: clear to                         auscultation. CV Examination: RRR, no murmurs, no S3                         or S4. Prophylactic Antibiotics: The patient  does not                         require prophylactic antibiotics. Prior                         Anticoagulants: The patient has taken no anticoagulant                         or antiplatelet agents. ASA  Grade Assessment: III - A                         patient with severe systemic disease. After reviewing                         the risks and benefits, the patient was deemed in                         satisfactory condition to undergo the procedure. The                         anesthesia plan was to use monitored anesthesia care                         (MAC). Immediately prior to administration of                         medications, the patient was re-assessed for adequacy                         to receive sedatives. The heart rate, respiratory                         rate, oxygen saturations, blood pressure, adequacy of                         pulmonary ventilation, and response to care were                         monitored throughout the procedure. The physical                         status of the patient was re-assessed after the                         procedure.                        After obtaining informed consent, the endoscope was                         passed under direct vision. Throughout the procedure,                         the patient's blood pressure, pulse, and oxygen                         saturations were monitored continuously. The Endoscope  was introduced through the mouth, and advanced to the                         duodenal bulb. The upper GI endoscopy was accomplished                         without difficulty. The patient tolerated the                         procedure well. Findings:      An acquired benign-appearing, intrinsic severe stenosis was found in the       second portion of the duodenum and was non-traversed. Estimated blood       loss was minimal. Area was friable. Could not clearly visualize an       ulceration, but thhis may be on posterior wall      The exam of the duodenum was otherwise normal.      The entire examined stomach was normal. Estimated blood loss: none.      The Z-line was regular. Estimated blood loss:  none.      Esophagogastric landmarks were identified: the gastroesophageal junction       was found at 38 cm from the incisors.      The examined esophagus was normal. Estimated blood loss: none. Impression:            - Acquired duodenal stenosis.                        - Normal stomach.                        - Z-line regular.                        - Esophagogastric landmarks identified.                        - Normal esophagus.                        - No specimens collected. Recommendation:        - Patient has a contact number available for                         emergencies. The signs and symptoms of potential                         delayed complications were discussed with the patient.                         Return to normal activities tomorrow. Written                         discharge instructions were provided to the patient.                        - Return patient to hospital ward for ongoing care.                        - Soft diet.                        -  Continue present medications.                        - No aspirin, ibuprofen, naproxen, or other                         non-steroidal anti-inflammatory drugs indefinitely.                        - Tobacco and alcohol cessation recommended.                        - Use Protonix (pantoprazole) 40 mg PO BID for 12                         weeks.                        - Repeat upper endoscopy at appointment to be                         scheduled to evaluate the response to therapy.                        - Will consider referral to center that can perform                         dilation under fluoroscopy given location                        - Return to GI clinic at appointment to be scheduled.                        - The findings and recommendations were discussed with                         the patient.                        - The findings and recommendations were discussed with                         the  patient's family.                        - The findings and recommendations were discussed with                         the referring physician. Procedure Code(s):     --- Professional ---                        [phone], Esophagogastroduodenoscopy, flexible,                         transoral; diagnostic, including collection of                         specimen(s) by brushing or washing, when performed                         (separate procedure) Diagnosis  Code(s):     --- Professional ---                        K31.5, Obstruction of duodenum                        R10.13, Epigastric pain CPT copyright 2022 [employer]. All rights reserved. The codes documented in this report are preliminary and upon coder review may  be revised to meet current compliance requirements. Attending Participation:      I personally performed the entire procedure. Elfredia Nevins, DO [name] DO, DO 01/30/2023 2:46:16 PM This report has been signed electronically. Number of Addenda: 0 Note Initiated On: 01/30/2023 2:05 PM Estimated Blood Loss:  Estimated blood loss was minimal.      [facility]

## 2023-01-30 NOTE — Anesthesia Preprocedure Evaluation (Signed)
Anesthesia Evaluation  Patient identified by MRN, date of birth, ID band Patient awake    Reviewed: Allergy & Precautions, NPO status , Patient's Chart, lab work & pertinent test results  History of Anesthesia Complications Negative for: history of anesthetic complications  Airway Mallampati: II  TM Distance: >3 FB Neck ROM: Full    Dental  (+) Upper Dentures   Pulmonary neg sleep apnea, COPD, Current Smoker and Patient abstained from smoking.   Pulmonary exam normal breath sounds clear to auscultation       Cardiovascular Exercise Tolerance: Good METShypertension, Pt. on medications (-) CAD and (-) Past MI (-) dysrhythmias  Rhythm:Regular Rate:Normal - Systolic murmurs    Neuro/Psych  PSYCHIATRIC DISORDERS Anxiety Depression    negative neurological ROS     GI/Hepatic ,neg GERD  ,,(+)     (-) substance abuse    Endo/Other  neg diabetes    Renal/GU negative Renal ROS     Musculoskeletal   Abdominal   Peds  Hematology   Anesthesia Other Findings Past Medical History: No date: Anxiety No date: Bronchitis No date: Depression No date: Hyperlipidemia No date: Hypertension  Reproductive/Obstetrics                              Anesthesia Physical Anesthesia Plan  ASA: 3  Anesthesia Plan: General   Post-op Pain Management:    Induction: Intravenous  PONV Risk Score and Plan: 2 and Ondansetron and Dexamethasone  Airway Management Planned: Oral ETT  Additional Equipment: None  Intra-op Plan:   Post-operative Plan: Extubation in OR  Informed Consent: I have reviewed the patients History and Physical, chart, labs and discussed the procedure including the risks, benefits and alternatives for the proposed anesthesia with the patient or authorized representative who has indicated his/her understanding and acceptance.     Dental advisory given  Plan Discussed with: CRNA and  Surgeon  Anesthesia Plan Comments: (Discussed risks of anesthesia with patient, including PONV, sore throat, lip/dental/eye damage. Rare risks discussed as well, such as cardiorespiratory and neurological sequelae, and allergic reactions. Discussed the role of CRNA in patient's perioperative care. Patient understands. Patient counseled on benefits of smoking cessation, and increased perioperative risks associated with continued smoking. )         Anesthesia Quick Evaluation

## 2023-01-30 NOTE — Discharge Summary (Signed)
Physician Discharge Summary   Catherine Mcbride  female DOB: [birth year]  AOZ:[id number]  PCP: Marina Goodell, MD  Admit date: 01/28/2023 Discharge date: 01/30/2023  Admitted From: home Disposition:  home Husband updated at bedside prior to discharge. 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  You are started on protonix 40 mg twice daily to be taken for 3 months.   Soft and liquid foods for diet.  No more Goody Powder, alcohol and tobacco.  You will follow up with GI for repeat EGD in 8-12 weeks.   Because your blood pressure was low, I have held your hydrochlorothiazide and Lisinopril, pending followup with your primary care doctor.   Dr. Darlin Priestly [facility] Course:  For full details, please see H&P, progress notes, consult notes and ancillary notes.  Briefly,  COURTNEI RUDDELL is a 62 y.o. female with medical history significant for essential hypertension, COPD, chronic NSAIDs use (BC powder for years) current tobacco and alcohol user, who presented to [facility] ED from home with complaints of severe epigastric pain for the past 3 days.    Associated with nausea and vomiting.  Seen in the ED 2 days ago with the same symptoms.  CT scan was done and the results were nonrevealing.  She returned due to worsening symptoms.  States her pain was so severe that it was difficult to take some breaths.    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 Area is friable. Did not visualize posterior bulb - so unknown if ulcer present.  Area was not dilated." --pt was discharged on protonix 40 mg BID for 12 weeks. --GI to arrange for follow up in the office and for repeat egd. --Complete nsaid, tobacco, and etoh cessation recommended --Soft and liquid foods as area heals.   nausea and vomiting  Enteritis --seen on CT scan, started on ceftriaxone and  flagyl, however, no strong evidence of infection, so did not continue abx. --pt received gentle MIVF.  N/V resolved prior to discharge.   AKI, suspect prerenal in the setting of dehydration from GI losses, vomiting, poor oral intake --Cr 1.8 on presentation.  Baseline 0.7 --received gentle MIVF.  Cr 0.64 prior to discharge.   Fatty liver Counseled on the importance of alcohol cessation by admitting physician.     Hypovolemic hyponatremia --Na 131, improved with MIVF.   Hyperglycemia --not clinically significant.  A1c 5.6, not diabetic. --d/c'ed BG checks and SSI   Hypotension/hypovolemia Responded well to IV fluid boluses --home HCTZ and Lisinopril held pending PCP followup.   Tobacco use disorder Tobacco cessation counseling done at bedside by admitting physician.   Alcohol use disorder Alcohol cessation counseling done at bedside by admitting physician. No evidence of alcohol withdrawal    Unless noted above, medications under "STOP" list are ones pt was not taking PTA.  Discharge Diagnoses:  Principal Problem:   Intractable nausea and vomiting Active Problems:   Epigastric pain   30 Day Unplanned Readmission Risk Score    Flowsheet Row ED to Hosp-Admission (Current) from 01/28/2023 in [facility]  30 Day Unplanned Readmission Risk Score (%) 16.76 Filed at 01/30/2023 1200       This score is the patient's risk of an unplanned readmission within 30 days of being discharged (0 -100%). The score is based on  dignosis, age, lab data, medications, orders, and past utilization.   Low:  0-14.9   Medium: 15-21.9   High: 22-29.9   Extreme: 30 and above         Discharge Instructions:  Allergies as of 01/30/2023   No Known Allergies      Medication List     STOP taking these medications    methocarbamol 500 MG tablet Commonly known as: ROBAXIN   predniSONE 10 MG (21) Tbpk tablet Commonly known as: STERAPRED UNI-PAK 21 TAB        TAKE these medications    albuterol 108 (90 Base) MCG/ACT inhaler Commonly known as: VENTOLIN HFA Inhale 1-2 puffs into the lungs every 4 (four) hours as needed.   fluticasone 50 MCG/ACT nasal spray Commonly known as: FLONASE Place 2 sprays into both nostrils daily.   gemfibrozil 600 MG tablet Commonly known as: LOPID Take 600 mg by mouth 2 (two) times daily before a meal.   hydrochlorothiazide 25 MG tablet Commonly known as: HYDRODIURIL Hold until followup with primary care doctor due to low blood pressure. What changed:  how much to take how to take this when to take this additional instructions   lisinopril 30 MG tablet Commonly known as: ZESTRIL Hold until followup with primary care doctor due to low blood pressure. What changed:  how much to take how to take this when to take this additional instructions   ondansetron 4 MG disintegrating tablet Commonly known as: ZOFRAN-ODT Allow 1-2 tablets to dissolve in your mouth every 8 hours as needed for nausea/vomiting   oxyCODONE-acetaminophen 5-325 MG tablet Commonly known as: Percocet Take 1 tablet by mouth every 8 (eight) hours as needed for up to 5 days for severe pain. What changed:  how much to take when to take this   pantoprazole 40 MG tablet Commonly known as: Protonix Take 1 tablet (40 mg total) by mouth 2 (two) times daily.   Wixela Inhub 250-50 MCG/ACT Aepb Generic drug: fluticasone-salmeterol Inhale 1 puff into the lungs in the morning and at bedtime.         Follow-up Information     [name], DO Follow up.   Specialty: Gastroenterology Contact information: [address] [facility] [place] [address] [phone]         Marina Goodell, MD Follow up in 1 week(s).   Specialty: Family Medicine Contact information: [address] [place] [address] [phone]                 No Known Allergies   The results of significant  diagnostics from this hospitalization (including imaging, microbiology, ancillary and laboratory) are listed below for reference.   Consultations:   Procedures/Studies: CT Angio Chest/Abd/Pel for Dissection W and/or Wo Contrast  Result Date: 01/28/2023 CLINICAL DATA:  Acute aortic syndrome suspected EXAM: CT ANGIOGRAPHY CHEST, ABDOMEN AND PELVIS TECHNIQUE: Non-contrast CT of the chest was initially obtained. Multidetector CT imaging through the chest, abdomen and pelvis was performed using the standard protocol during bolus administration of intravenous contrast. Multiplanar reconstructed images and MIPs were obtained and reviewed to evaluate the vascular anatomy. RADIATION DOSE REDUCTION: This exam was performed according to the departmental dose-optimization program which includes automated exposure control, adjustment of the mA and/or kV according to patient size and/or use of iterative reconstruction technique. CONTRAST:  55mL OMNIPAQUE IOHEXOL 350 MG/ML SOLN COMPARISON:  CT abdomen pelvis, 01/26/2023 FINDINGS: CTA CHEST FINDINGS VASCULAR Aorta: Satisfactory opacification of the aorta. Normal contour and  caliber of the thoracic aorta. No evidence of aneurysm, dissection, or other acute aortic pathology. Mild mixed aortic atherosclerosis Cardiovascular: No evidence of pulmonary embolism on limited non-tailored examination. Normal heart size. Scattered left coronary artery calcifications. No pericardial effusion. Review of the MIP images confirms the above findings. NON VASCULAR Mediastinum/Nodes: No enlarged mediastinal, hilar, or axillary lymph nodes. Thyroid gland, trachea, and esophagus demonstrate no significant findings. Lungs/Pleura: Mild diffuse bilateral bronchial wall thickening. Background of fine centrilobular pulmonary nodularity, most concentrated at the lung apices. No pleural effusion or pneumothorax. Musculoskeletal: No chest wall abnormality. No acute osseous findings. Review of the MIP  images confirms the above findings. CTA ABDOMEN AND PELVIS FINDINGS VASCULAR Normal contour and caliber of the abdominal aorta. No evidence of aneurysm, dissection, or other acute aortic pathology. Standard branching pattern of the abdominal aorta with solitary bilateral renal arteries. Moderate mixed calcific atherosclerosis. Review of the MIP images confirms the above findings. NON-VASCULAR Hepatobiliary: No solid liver abnormality is seen. Coarse contour of the liver. Hepatic steatosis. No gallstones, gallbladder wall thickening, or biliary dilatation. Pancreas: Unremarkable. No pancreatic ductal dilatation or surrounding inflammatory changes. Spleen: Normal in size without significant abnormality. Adrenals/Urinary Tract: Adrenal glands are unremarkable. Nonobstructive left nephrolithiasis. No right-sided calculi, ureteral calculi, or hydronephrosis. Bladder is unremarkable. Stomach/Bowel: Stomach is within normal limits. Air and fluid-filled, although nondistended loops of small bowel (series 7, image 27). Scattered, thickened appearing loops of small bowel, for example in the central abdomen (series 7, image 23). Lymphatic: No enlarged abdominal or pelvic lymph nodes. Reproductive: Status post hysterectomy. Other: No abdominal wall hernia or abnormality. Small volume ascites throughout the abdomen and pelvis. Musculoskeletal: No acute osseous findings. IMPRESSION: 1. Normal contour and caliber of the thoracic and abdominal aorta. No evidence of aneurysm, dissection, or other acute aortic pathology. Moderate mixed calcific atherosclerosis. 2. Air and fluid-filled, although nondistended loops of small bowel. Scattered, thickened appearing loops of small bowel, for example in the central abdomen. Findings are suggestive of nonspecific infectious or inflammatory enteritis. 3. Small volume ascites throughout the abdomen and pelvis. 4. Hepatic steatosis and coarse contour of the liver, suggestive of cirrhosis. 5.  Mild diffuse bilateral bronchial wall thickening and background of fine centrilobular pulmonary nodularity, consistent with smoking-related respiratory bronchiolitis. 6. Background of fine centrilobular pulmonary nodularity, most concentrated at the lung apices, consistent with smoking-related respiratory bronchiolitis. 7. Nonobstructive left nephrolithiasis. 8. Coronary artery disease. Aortic Atherosclerosis (ICD10-I70.0). Electronically Signed   By: Jearld Lesch M.D.   On: 01/28/2023 22:18   US ABDOMEN LIMITED RUQ (LIVER/GB)  Result Date: 01/28/2023 CLINICAL DATA:  Epigastric abdominal pain EXAM: ULTRASOUND ABDOMEN LIMITED RIGHT UPPER QUADRANT COMPARISON:  CT abdomen and pelvis 01/26/2023 FINDINGS: Gallbladder: No gallstones or wall thickening visualized. No sonographic Murphy sign noted by sonographer. Common bile duct: Diameter: 6.9 mm Liver: No focal lesion identified. Increase in parenchymal echogenicity. Portal vein is patent on color Doppler imaging with normal direction of blood flow towards the liver. Other: Small amount of ascites in the right upper quadrant. IMPRESSION: 1. The common bile duct is mildly dilated measuring 6.9 mm. Recommend correlation with LFTs. 2. Small amount of ascites in the right upper quadrant. 3. Increased echogenicity in the liver is nonspecific but often due to hepatic steatosis. Electronically Signed   By: Darliss Cheney M.D.   On: 01/28/2023 22:11   CT ABDOMEN PELVIS W CONTRAST  Result Date: 01/26/2023 CLINICAL DATA:  Abdominal pain. EXAM: CT ABDOMEN AND PELVIS WITH CONTRAST TECHNIQUE: Multidetector CT imaging of  the abdomen and pelvis was performed using the standard protocol following bolus administration of intravenous contrast. RADIATION DOSE REDUCTION: This exam was performed according to the departmental dose-optimization program which includes automated exposure control, adjustment of the mA and/or kV according to patient size and/or use of iterative  reconstruction technique. CONTRAST:  OMNIPAQUE IOHEXOL 300 MG/ML  SOLN COMPARISON:  Feb 09, 2015 FINDINGS: Lower chest: No acute abnormality. Hepatobiliary: There is diffuse fatty infiltration of the liver parenchyma. No focal liver abnormality is seen. No gallstones, gallbladder wall thickening, or biliary dilatation. Pancreas: Unremarkable. No pancreatic ductal dilatation or surrounding inflammatory changes. Spleen: Normal in size without focal abnormality. Adrenals/Urinary Tract: Adrenal glands are unremarkable. Kidneys are normal, without obstructing renal calculi, focal lesion, or hydronephrosis. A 9 mm nonobstructing renal calculus is seen within the mid left kidney. Bladder is unremarkable. Stomach/Bowel: Stomach is within normal limits. The appendix is poorly visualized. No evidence of bowel wall thickening, distention, or inflammatory changes. Fluid-filled small bowel loops are seen within the lower abdomen, however, these are normal in caliber Vascular/Lymphatic: Aortic atherosclerosis. No enlarged abdominal or pelvic lymph nodes. Reproductive: Status post hysterectomy. No adnexal masses. Other: No abdominal wall hernia or abnormality. A small amount of pelvic free fluid is seen. Musculoskeletal: No acute or significant osseous findings. IMPRESSION: 1. Hepatic steatosis. 2. 9 mm nonobstructing left renal calculus. 3. Small amount of pelvic free fluid. 4. Aortic atherosclerosis. Aortic Atherosclerosis (ICD10-I70.0). Electronically Signed   By: Aram Candela M.D.   On: 01/26/2023 03:21      Labs: BNP (last 3 results) No results for input(s): "BNP" in the last 8760 hours. Basic Metabolic Panel: Recent Labs  Lab 01/26/23 0054 01/28/23 2057 01/29/23 0415 01/29/23 0913 01/30/23 0603  NA 137 131* 135  --  133*  K 3.7 3.2* 6.2* 3.5 3.5  CL 100 93* 106  --  100  CO2 24 28 26   --  26  GLUCOSE 201* 158* 111*  --  103*  BUN 17 37* 27*  --  16  CREATININE 0.76 1.80* 0.99  --  0.64   CALCIUM 9.3 8.6* 7.1*  --  8.1*  MG  --   --  1.9  --  1.7  PHOS  --   --  3.0  --   --    Liver Function Tests: Recent Labs  Lab 01/26/23 0054 01/28/23 2057 01/29/23 0415  AST 30 25 24   ALT 24 16 12   ALKPHOS 70 55 42  BILITOT 0.8 1.0 0.6  PROT 7.6 6.7 5.0*  ALBUMIN 4.1 3.7 2.8*   Recent Labs  Lab 01/26/23 0054 01/28/23 2057  LIPASE 31 30   No results for input(s): "AMMONIA" in the last 168 hours. CBC: Recent Labs  Lab 01/26/23 0054 01/28/23 2057 01/29/23 0415 01/30/23 0603  WBC 11.9* 14.4* 10.1 6.5  HGB 13.6 14.1 11.7* 11.1*  HCT 38.1 39.6 33.1* 31.2*  MCV 88.2 86.7 88.7 88.6  PLT 284 340 253 233   Cardiac Enzymes: No results for input(s): "CKTOTAL", "CKMB", "CKMBINDEX", "TROPONINI" in the last 168 hours. BNP: Invalid input(s): "POCBNP" CBG: Recent Labs  Lab 01/29/23 0832  GLUCAP 123*   D-Dimer No results for input(s): "DDIMER" in the last 72 hours. Hgb A1c Recent Labs    01/29/23 0415  HGBA1C 5.6   Lipid Profile No results for input(s): "CHOL", "HDL", "LDLCALC", "TRIG", "CHOLHDL", "LDLDIRECT" in the last 72 hours. Thyroid function studies No results for input(s): "TSH", "T4TOTAL", "T3FREE", "THYROIDAB" in the last 72  hours.  Invalid input(s): "FREET3" Anemia work up Recent Labs    01/30/23 0603  FERRITIN 163  TIBC 267  IRON 25*   Urinalysis    Component Value Date/Time   COLORURINE YELLOW (A) 01/28/2023 2206   APPEARANCEUR CLOUDY (A) 01/28/2023 2206   LABSPEC 1.013 01/28/2023 2206   PHURINE 5.0 01/28/2023 2206   GLUCOSEU NEGATIVE 01/28/2023 2206   HGBUR SMALL (A) 01/28/2023 2206   BILIRUBINUR NEGATIVE 01/28/2023 2206   KETONESUR NEGATIVE 01/28/2023 2206   PROTEINUR 30 (A) 01/28/2023 2206   NITRITE NEGATIVE 01/28/2023 2206   LEUKOCYTESUR NEGATIVE 01/28/2023 2206   Sepsis Labs Recent Labs  Lab 01/26/23 0054 01/28/23 2057 01/29/23 0415 01/30/23 0603  WBC 11.9* 14.4* 10.1 6.5   Microbiology Recent Results (from the past 240  hour(s))  Blood culture (routine x 2)     Status: None (Preliminary result)   Collection Time: 01/28/23  9:19 PM   Specimen: BLOOD  Result Value Ref Range Status   Specimen Description BLOOD BLOOD LEFT ARM  Final   Special Requests   Final    BOTTLES DRAWN AEROBIC AND ANAEROBIC Blood Culture results may not be optimal due to an inadequate volume of blood received in culture bottles   Culture   Final    NO GROWTH 2 DAYS Performed at [facility], [address], [place], [place] [address]    Report Status PENDING  Incomplete  Blood culture (routine x 2)     Status: None (Preliminary result)   Collection Time: 01/28/23  9:19 PM   Specimen: BLOOD  Result Value Ref Range Status   Specimen Description BLOOD BLOOD LEFT FOREARM  Final   Special Requests   Final    BOTTLES DRAWN AEROBIC AND ANAEROBIC Blood Culture results may not be optimal due to an inadequate volume of blood received in culture bottles   Culture   Final    NO GROWTH 2 DAYS Performed at [facility], [address], [place], [place] [address]    Report Status PENDING  Incomplete     Total time spend on discharging this patient, including the last patient exam, discussing the hospital stay, instructions for ongoing care as it relates to all pertinent caregivers, as well as preparing the medical discharge records, prescriptions, and/or referrals as applicable, is 35 minutes.    Darlin Priestly, MD  [facility] 01/30/2023, 2:58 PM

## 2023-01-30 NOTE — Interval H&P Note (Signed)
History and Physical Interval Note: Consult note 01/30/23 was reviewed and there was no interval change after seeing and examining the patient. Labs reviewed and unchanged. Written consent was obtained from the patient after discussion of risks, benefits, and alternatives. Patient has consented to proceed with Esophagogastroduodenoscopy with possible intervention  Esophagogastroduodenoscopy with possible biopsy, control of bleeding, polypectomy, and interventions as necessary has been discussed with the patient/patient representative. Informed consent was obtained from the patient/patient representative after explaining the indication, nature, and risks of the procedure including but not limited to death, bleeding, perforation, missed neoplasm/lesions, cardiorespiratory compromise, and reaction to medications. Opportunity for questions was given and appropriate answers were provided. Patient/patient representative has verbalized understanding is amenable to undergoing the procedure.    01/30/2023 2:03 PM  Catherine Mcbride  has presented today for surgery, with the diagnosis of epigastric pain, nausea.  The various methods of treatment have been discussed with the patient and family. After consideration of risks, benefits and other options for treatment, the patient has consented to  Procedure(s): ESOPHAGOGASTRODUODENOSCOPY (EGD) WITH PROPOFOL (N/A) as a surgical intervention.  The patient's history has been reviewed, patient examined, no change in status, stable for surgery.  I have reviewed the patient's chart and labs.  Questions were answered to the patient's satisfaction.     [name]

## 2023-01-30 NOTE — Transfer of Care (Signed)
Immediate Anesthesia Transfer of Care Note  Patient: Catherine Mcbride  Procedure(s) Performed: ESOPHAGOGASTRODUODENOSCOPY (EGD) WITH PROPOFOL  Patient Location: Endoscopy Unit  Anesthesia Type:General  Level of Consciousness: sedated  Airway & Oxygen Therapy: Patient Spontanous Breathing and Patient connected to nasal cannula oxygen  Post-op Assessment: Report given to RN and Post -op Vital signs reviewed and stable  Post vital signs: Reviewed  Last Vitals:  Vitals Value Taken Time  BP 134/72   Temp    Pulse 79   Resp 16   SpO2 100     Last Pain:  Vitals:   01/30/23 1100  TempSrc:   PainSc: 0-No pain      Patients Stated Pain Goal: 0 (01/28/23 2053)  Complications: No notable events documented.

## 2023-01-30 NOTE — Anesthesia Postprocedure Evaluation (Signed)
Anesthesia Post Note  Patient: Bary Leriche Brunke  Procedure(s) Performed: ESOPHAGOGASTRODUODENOSCOPY (EGD) WITH PROPOFOL  Patient location during evaluation: Endoscopy Anesthesia Type: General Level of consciousness: awake and alert Pain management: pain level controlled Vital Signs Assessment: post-procedure vital signs reviewed and stable Respiratory status: spontaneous breathing, nonlabored ventilation, respiratory function stable and patient connected to nasal cannula oxygen Cardiovascular status: blood pressure returned to baseline and stable Postop Assessment: no apparent nausea or vomiting Anesthetic complications: no   No notable events documented.   Last Vitals:  Vitals:   01/30/23 0925 01/30/23 1420  BP: 108/74 113/65  Pulse: 71 78  Resp: 18 14  Temp: 36.9 C   SpO2: 96% 99%    Last Pain:  Vitals:   01/30/23 1100  TempSrc:   PainSc: 0-No pain                 Corinda Gubler

## 2023-01-30 NOTE — Care Plan (Signed)
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 Area is friable. Did not visualize posterior bulb - so unknown if ulcer present Area was not dilated  Recommend twice daily protonix 40 mg for 12 weeks Will arrange for follow up in the office and for repeat egd Complete nsaid, tobacco, and etoh cessation recommended Soft and liquid foods as area heals.  Ok for dispo from GI perspective.  Case discussed with the patient, her husband and hospitalist  Enis Slipper, DO [facility]

## 2023-01-31 ENCOUNTER — Encounter: Payer: Self-pay | Admitting: Gastroenterology

## 2023-01-31 LAB — HEPATITIS B SURFACE ANTIBODY, QUANTITATIVE: Hep B S AB Quant (Post): 69.6 m[IU]/mL (ref >9.9)

## 2023-02-01 LAB — CULTURE, BLOOD (ROUTINE X 2)

## 2023-02-02 ENCOUNTER — Encounter: Payer: Self-pay | Admitting: Gastroenterology

## 2023-02-02 LAB — CULTURE, BLOOD (ROUTINE X 2): Culture: NO GROWTH

## 2023-02-05 LAB — MISC LABCORP TEST (SEND OUT): Labcorp test code: 791584

## 2023-02-06 ENCOUNTER — Encounter: Payer: Self-pay | Admitting: Gastroenterology

## 2023-05-17 ENCOUNTER — Encounter: Payer: Self-pay | Admitting: Gastroenterology

## 2023-05-17 ENCOUNTER — Encounter
Admission: RE | Disposition: A | Discharge status: Home / Self Care | Payer: Self-pay | Source: Home / Self Care | Attending: Gastroenterology

## 2023-05-17 ENCOUNTER — Ambulatory Visit: Payer: BC Managed Care – PPO | Admitting: Anesthesiology

## 2023-05-17 ENCOUNTER — Ambulatory Visit
Admission: RE | Admit: 2023-05-17 | Discharge: 2023-05-17 | Disposition: A | Discharge status: Home / Self Care | Payer: BC Managed Care – PPO | Attending: Gastroenterology | Admitting: Gastroenterology

## 2023-05-17 DIAGNOSIS — K315 Obstruction of duodenum: Secondary | ICD-10-CM | POA: Insufficient documentation

## 2023-05-17 DIAGNOSIS — K2960 Other gastritis without bleeding: Secondary | ICD-10-CM | POA: Diagnosis not present

## 2023-05-17 DIAGNOSIS — K3189 Other diseases of stomach and duodenum: Secondary | ICD-10-CM | POA: Diagnosis not present

## 2023-05-17 HISTORY — PX: ESOPHAGOGASTRODUODENOSCOPY: SHX5428

## 2023-05-17 HISTORY — PX: BIOPSY: SHX5522

## 2023-05-17 SURGERY — EGD (ESOPHAGOGASTRODUODENOSCOPY)
Anesthesia: General

## 2023-05-17 MED ORDER — LIDOCAINE HCL (CARDIAC) PF 100 MG/5ML IV SOSY
PREFILLED_SYRINGE | INTRAVENOUS | Status: DC | PRN
  Administered 2023-05-17: 100 mg via INTRAVENOUS

## 2023-05-17 MED ORDER — PROPOFOL 500 MG/50ML IV EMUL
INTRAVENOUS | Status: DC | PRN
  Administered 2023-05-17: 145 ug/kg/min via INTRAVENOUS

## 2023-05-17 MED ORDER — PROPOFOL 10 MG/ML IV BOLUS
INTRAVENOUS | Status: DC | PRN
  Administered 2023-05-17: 70 mg via INTRAVENOUS
  Administered 2023-05-17: 30 mg via INTRAVENOUS

## 2023-05-17 MED ORDER — MIDAZOLAM HCL 2 MG/2ML IJ SOLN
INTRAMUSCULAR | Status: AC
  Filled 2023-05-17: qty 2

## 2023-05-17 MED ORDER — SODIUM CHLORIDE 0.9 % IV SOLN
INTRAVENOUS | Status: DC
  Administered 2023-05-17: 1000 mL via INTRAVENOUS

## 2023-05-17 MED ORDER — MIDAZOLAM HCL 2 MG/2ML IJ SOLN
INTRAMUSCULAR | Status: DC | PRN
  Administered 2023-05-17: 2 mg via INTRAVENOUS

## 2023-05-17 MED ORDER — GLYCOPYRROLATE 0.2 MG/ML IJ SOLN
INTRAMUSCULAR | Status: DC | PRN
  Administered 2023-05-17: .2 mg via INTRAVENOUS

## 2023-05-17 NOTE — Op Note (Signed)
[facility] Gastroenterology Patient Name: Catherine Mcbride Procedure Date: 05/17/2023 8:25 AM MRN: [id number] Account #: [id number] Date of Birth: [birth year] Admit Type: Outpatient Age: 62 Room: [facility] ENDO ROOM 1 Gender: Female Note Status: Finalized Instrument Name: Upper Endoscope [id number] Procedure:             Upper GI endoscopy Indications:           For therapy of duodenal stenosis Providers:             [name] DO, DO Referring MD:          Marina Goodell (Referring MD) Medicines:             Monitored Anesthesia Care Complications:         No immediate complications. Estimated blood loss:                         Minimal. Procedure:             Pre-Anesthesia Assessment:                        - Prior to the procedure, a History and Physical was                         performed, and patient medications and allergies were                         reviewed. The patient is competent. The risks and                         benefits of the procedure and the sedation options and                         risks were discussed with the patient. All questions                         were answered and informed consent was obtained.                         Patient identification and proposed procedure were                         verified by the physician, the nurse, the anesthetist                         and the technician in the endoscopy suite. Mental                         Status Examination: alert and oriented. Airway                         Examination: normal oropharyngeal airway and neck                         mobility. Respiratory Examination: clear to                         auscultation. CV Examination: RRR, no murmurs, no S3  or S4. Prophylactic Antibiotics: The patient does not                         require prophylactic antibiotics. Prior                         Anticoagulants: The patient has taken no anticoagulant                          or antiplatelet agents. ASA Grade Assessment: II - A                         patient with mild systemic disease. After reviewing                         the risks and benefits, the patient was deemed in                         satisfactory condition to undergo the procedure. The                         anesthesia plan was to use monitored anesthesia care                         (MAC). Immediately prior to administration of                         medications, the patient was re-assessed for adequacy                         to receive sedatives. The heart rate, respiratory                         rate, oxygen saturations, blood pressure, adequacy of                         pulmonary ventilation, and response to care were                         monitored throughout the procedure. The physical                         status of the patient was re-assessed after the                         procedure.                        After obtaining informed consent, the endoscope was                         passed under direct vision. Throughout the procedure,                         the patient's blood pressure, pulse, and oxygen                         saturations were monitored continuously. The Endoscope  was introduced through the mouth, and advanced to the                         second part of duodenum. The upper GI endoscopy was                         accomplished without difficulty. The patient tolerated                         the procedure well. Findings:      An acquired benign-appearing, intrinsic moderate stenosis was found in       the first portion of the duodenum and was traversed. A TTS dilator was       passed through the scope. Dilation with a 07-13-11 mm balloon dilator       was performed to 11 mm and 12 mm performed over guidewire. The dilation       site was examined following endoscope reinsertion and showed mild       improvement  in luminal narrowing. Estimated blood loss was minimal.      The exam of the duodenum was otherwise normal.      A single localized 1 mm erosion with no bleeding and no stigmata of       recent bleeding was found in the gastric antrum. Biopsies were taken       with a cold forceps for Helicobacter pylori testing. Estimated blood       loss was minimal.      The exam of the stomach was otherwise normal.      The Z-line was regular.      Esophagogastric landmarks were identified: the gastroesophageal junction       was found at 38 cm from the incisors.      The exam of the esophagus was otherwise normal. Impression:            - Acquired duodenal stenosis. Dilated.                        - Erosive gastropathy with no bleeding and no stigmata                         of recent bleeding. Biopsied.                        - Z-line regular.                        - Esophagogastric landmarks identified. Recommendation:        - Patient has a contact number available for                         emergencies. The signs and symptoms of potential                         delayed complications were discussed with the patient.                         Return to normal activities tomorrow. Written                         discharge instructions were provided  to the patient.                        - Resume previous diet.                        - Continue present medications.                        - Await pathology results.                        - Repeat upper endoscopy PRN for retreatment.                        - Return to GI office as previously scheduled.                        - The findings and recommendations were discussed with                         the patient. Procedure Code(s):     --- Professional ---                        [phone], 51, Esophagogastroduodenoscopy, flexible,                         transoral; with dilation of gastric/duodenal                         stricture(s) (eg, balloon,  bougie)                        [id number], 59, Esophagogastroduodenoscopy, flexible,                         transoral; with biopsy, single or multiple Diagnosis Code(s):     --- Professional ---                        K31.5, Obstruction of duodenum                        K31.89, Other diseases of stomach and duodenum CPT copyright 2022 American [employer] Association. All rights reserved. The codes documented in this report are preliminary and upon coder review may  be revised to meet current compliance requirements. Attending Participation:      I personally performed the entire procedure. Elfredia Nevins, DO [name] DO, DO 05/17/2023 9:05:43 AM This report has been signed electronically. Number of Addenda: 0 Note Initiated On: 05/17/2023 8:25 AM Estimated Blood Loss:  Estimated blood loss was minimal.      [facility]

## 2023-05-17 NOTE — H&P (Signed)
Pre-Procedure H&P   Patient ID: Catherine Mcbride is a 62 y.o. female.  Gastroenterology Provider: [name], DO  PCP: Marina Goodell, MD  Date: 05/17/2023  HPI Ms. Catherine Mcbride is a 62 y.o. female who presents today for Esophagogastroduodenoscopy for Duodenal stricture .  Patient was seen in April with nausea vomiting and found to have a duodenal stricture and ulcer.  This is secondary to NSAID/Goody's, tobacco and alcohol dependence.  She has been on twice a day PPI and been seen in the office in the interval and is feeling much better.  She is undergoing EGD today to potentially dilate duodenal stricture  No further n/v/abdominal pain. Tolerating po without issue  Hemoglobin 14.6 MCV 86 platelets 284,000 creatinine 0.7 INR 1.0   Past Medical History:  Diagnosis Date   Anxiety    Bronchitis    Depression    Hyperlipidemia    Hypertension     Past Surgical History:  Procedure Laterality Date   ABDOMINAL HYSTERECTOMY     COLONOSCOPY WITH PROPOFOL N/A 05/14/2015   Procedure: COLONOSCOPY WITH PROPOFOL;  Surgeon: Elnita Maxwell, MD;  Location: [facility] ENDOSCOPY;  Service: Endoscopy;  Laterality: N/A;   ESOPHAGOGASTRODUODENOSCOPY (EGD) WITH PROPOFOL N/A 01/30/2023   Procedure: ESOPHAGOGASTRODUODENOSCOPY (EGD) WITH PROPOFOL;  Surgeon: [name], DO;  Location: [facility] ENDOSCOPY;  Service: Gastroenterology;  Laterality: N/A;   TUBAL LIGATION     WISDOM TOOTH EXTRACTION      Family History No h/o GI disease or malignancy  Review of Systems  Constitutional:  Negative for activity change, appetite change, chills, diaphoresis, fatigue, fever and unexpected weight change.  HENT:  Negative for trouble swallowing and voice change.   Respiratory:  Negative for shortness of breath and wheezing.   Cardiovascular:  Negative for chest pain, palpitations and leg swelling.  Gastrointestinal:  Negative for abdominal distention, abdominal pain, anal bleeding,  blood in stool, constipation, diarrhea, nausea, rectal pain and vomiting.  Musculoskeletal:  Negative for arthralgias and myalgias.  Skin:  Negative for color change and pallor.  Neurological:  Negative for dizziness, syncope and weakness.  Psychiatric/Behavioral:  Negative for confusion.   All other systems reviewed and are negative.    Medications No current facility-administered medications on file prior to encounter.   Current Outpatient Medications on File Prior to Encounter  Medication Sig Dispense Refill   albuterol (VENTOLIN HFA) 108 (90 Base) MCG/ACT inhaler Inhale 1-2 puffs into the lungs every 4 (four) hours as needed.     fluticasone (FLONASE) 50 MCG/ACT nasal spray Place 2 sprays into both nostrils daily.     gemfibrozil (LOPID) 600 MG tablet Take 600 mg by mouth 2 (two) times daily before a meal.     L-Lysine 1000 MG TABS Take 1,000 mg by mouth daily.     levalbuterol (XOPENEX HFA) 45 MCG/ACT inhaler Inhale into the lungs every 4 (four) hours as needed for wheezing.     lisinopril (ZESTRIL) 30 MG tablet Hold until followup with primary care doctor due to low blood pressure.     niacin (,VITAMIN B3,) 500 MG tablet Take 500 mg by mouth at bedtime.     ondansetron (ZOFRAN-ODT) 4 MG disintegrating tablet Allow 1-2 tablets to dissolve in your mouth every 8 hours as needed for nausea/vomiting 30 tablet 0   pantoprazole (PROTONIX) 40 MG tablet Take 1 tablet (40 mg total) by mouth 2 (two) times daily. 60 tablet 2   WIXELA INHUB 250-50 MCG/ACT AEPB Inhale 1 puff  into the lungs in the morning and at bedtime.     hydrochlorothiazide (HYDRODIURIL) 25 MG tablet Hold until followup with primary care doctor due to low blood pressure. (Patient not taking: Reported on 05/17/2023)      Pertinent medications related to GI and procedure were reviewed by me with the patient prior to the procedure   Current Facility-Administered Medications:    0.9 %  sodium chloride infusion, , Intravenous,  Continuous, [name], DO, Last Rate: 20 mL/hr at 05/17/23 0801, Continued from Pre-op at 05/17/23 0801  sodium chloride 1,000 mL (05/17/23 0801)       No Known Allergies Allergies were reviewed by me prior to the procedure  Objective   Body mass index is 26.72 kg/m. Vitals:   05/17/23 0750  BP: (!) 156/92  Pulse: 80  Resp: 17  Temp: (!) 96.9 F (36.1 C)  TempSrc: Temporal  SpO2: 99%  Weight: 64.1 kg  Height: 5\' 1"  (1.549 m)     Physical Exam Vitals and nursing note reviewed.  Constitutional:      General: She is not in acute distress.    Appearance: Normal appearance. She is not ill-appearing, toxic-appearing or diaphoretic.  HENT:     Head: Normocephalic and atraumatic.     Nose: Nose normal.     Mouth/Throat:     Mouth: Mucous membranes are moist.     Pharynx: Oropharynx is clear.  Eyes:     General: No scleral icterus.    Extraocular Movements: Extraocular movements intact.  Cardiovascular:     Rate and Rhythm: Normal rate and regular rhythm.     Heart sounds: Normal heart sounds. No murmur heard.    No friction rub. No gallop.  Pulmonary:     Effort: Pulmonary effort is normal. No respiratory distress.     Breath sounds: Normal breath sounds. No wheezing, rhonchi or rales.  Abdominal:     General: Bowel sounds are normal. There is no distension.     Palpations: Abdomen is soft.     Tenderness: There is no abdominal tenderness. There is no guarding or rebound.  Musculoskeletal:     Cervical back: Neck supple.     Right lower leg: No edema.     Left lower leg: No edema.  Skin:    General: Skin is warm and dry.     Coloration: Skin is not jaundiced or pale.  Neurological:     General: No focal deficit present.     Mental Status: She is alert and oriented to person, place, and time. Mental status is at baseline.  Psychiatric:        Mood and Affect: Mood normal.        Behavior: Behavior normal.        Thought Content: Thought content  normal.        Judgment: Judgment normal.      Assessment:  Ms. Catherine Mcbride is a 62 y.o. female  who presents today for Esophagogastroduodenoscopy for Duodenal stricture .  Plan:  Esophagogastroduodenoscopy with possible intervention today  Esophagogastroduodenoscopy with possible biopsy, control of bleeding, polypectomy, and interventions as necessary has been discussed with the patient/patient representative. Informed consent was obtained from the patient/patient representative after explaining the indication, nature, and risks of the procedure including but not limited to death, bleeding, perforation, missed neoplasm/lesions, cardiorespiratory compromise, and reaction to medications. Opportunity for questions was given and appropriate answers were provided. Patient/patient representative has verbalized understanding is amenable to undergoing the  procedure.   [name], DO  [facility] Gastroenterology  Portions of the record may have been created with voice recognition software. Occasional wrong-word or 'sound-a-like' substitutions may have occurred due to the inherent limitations of voice recognition software.  Read the chart carefully and recognize, using context, where substitutions may have occurred.

## 2023-05-17 NOTE — Transfer of Care (Signed)
Immediate Anesthesia Transfer of Care Note  Patient: Catherine Mcbride  Procedure(s) Performed: ESOPHAGOGASTRODUODENOSCOPY (EGD)  Patient Location: Endoscopy Unit  Anesthesia Type:General  Level of Consciousness: drowsy and patient cooperative  Airway & Oxygen Therapy: Patient Spontanous Breathing and Patient connected to face mask oxygen  Post-op Assessment: Report given to RN and Post -op Vital signs reviewed and stable  Post vital signs: Reviewed and stable  Last Vitals:  Vitals Value Taken Time  BP 122/73 05/17/23 0905  Temp    Pulse 71 05/17/23 0905  Resp 18 05/17/23 0905  SpO2 100 % 05/17/23 0905    Last Pain:  Vitals:   05/17/23 0905  TempSrc:   PainSc: 0-No pain         Complications: No notable events documented.

## 2023-05-17 NOTE — Anesthesia Postprocedure Evaluation (Signed)
Anesthesia Post Note  Patient: Catherine Mcbride  Procedure(s) Performed: ESOPHAGOGASTRODUODENOSCOPY (EGD)  Patient location during evaluation: Endoscopy Anesthesia Type: General Level of consciousness: awake and alert Pain management: pain level controlled Vital Signs Assessment: post-procedure vital signs reviewed and stable Respiratory status: spontaneous breathing, nonlabored ventilation, respiratory function stable and patient connected to nasal cannula oxygen Cardiovascular status: blood pressure returned to baseline and stable Postop Assessment: no apparent nausea or vomiting Anesthetic complications: no   No notable events documented.   Last Vitals:  Vitals:   05/17/23 0750 05/17/23 0905  BP: (!) 156/92 122/73  Pulse: 80 71  Resp: 17 18  Temp: (!) 36.1 C   SpO2: 99% 100%    Last Pain:  Vitals:   05/17/23 0905  TempSrc:   PainSc: 0-No pain                 Louie Boston

## 2023-05-17 NOTE — Anesthesia Preprocedure Evaluation (Addendum)
Anesthesia Evaluation  Patient identified by MRN, date of birth, ID band Patient awake    Reviewed: Allergy & Precautions, NPO status , Patient's Chart, lab work & pertinent test results  History of Anesthesia Complications Negative for: history of anesthetic complications  Airway Mallampati: III  TM Distance: >3 FB Neck ROM: full    Dental no notable dental hx.    Pulmonary COPD,  COPD inhaler, Current Smoker   Pulmonary exam normal        Cardiovascular hypertension, On Medications Normal cardiovascular exam     Neuro/Psych  PSYCHIATRIC DISORDERS Anxiety Depression    negative neurological ROS     GI/Hepatic negative GI ROS, Neg liver ROS,,,  Endo/Other  negative endocrine ROS    Renal/GU negative Renal ROS  negative genitourinary   Musculoskeletal   Abdominal   Peds  Hematology negative hematology ROS (+)   Anesthesia Other Findings Past Medical History: No date: Anxiety No date: Bronchitis No date: Depression No date: Hyperlipidemia No date: Hypertension  Past Surgical History: No date: ABDOMINAL HYSTERECTOMY 05/14/2015: COLONOSCOPY WITH PROPOFOL; N/A     Comment:  Procedure: COLONOSCOPY WITH PROPOFOL;  Surgeon: Elnita Maxwell, MD;  Location: [facility] ENDOSCOPY;  Service:               Endoscopy;  Laterality: N/A; 01/30/2023: ESOPHAGOGASTRODUODENOSCOPY (EGD) WITH PROPOFOL; N/A     Comment:  Procedure: ESOPHAGOGASTRODUODENOSCOPY (EGD) WITH               PROPOFOL;  Surgeon: [name], DO;  Location:              [facility] ENDOSCOPY;  Service: Gastroenterology;  Laterality:               N/A; No date: TUBAL LIGATION No date: WISDOM TOOTH EXTRACTION  BMI    Body Mass Index: 26.72 kg/m      Reproductive/Obstetrics negative OB ROS                             Anesthesia Physical Anesthesia Plan  ASA: 2  Anesthesia Plan: General   Post-op Pain  Management: Minimal or no pain anticipated   Induction: Intravenous  PONV Risk Score and Plan: 2 and Propofol infusion and TIVA  Airway Management Planned: Natural Airway and Nasal Cannula  Additional Equipment:   Intra-op Plan:   Post-operative Plan:   Informed Consent: I have reviewed the patients History and Physical, chart, labs and discussed the procedure including the risks, benefits and alternatives for the proposed anesthesia with the patient or authorized representative who has indicated his/her understanding and acceptance.     Dental Advisory Given  Plan Discussed with: Anesthesiologist, CRNA and Surgeon  Anesthesia Plan Comments: (Patient consented for risks of anesthesia including but not limited to:  - adverse reactions to medications - risk of airway placement if required - damage to eyes, teeth, lips or other oral mucosa - nerve damage due to positioning  - sore throat or hoarseness - Damage to heart, brain, nerves, lungs, other parts of body or loss of life  Patient voiced understanding.)       Anesthesia Quick Evaluation

## 2023-05-17 NOTE — Interval H&P Note (Signed)
History and Physical Interval Note: Preprocedure H&P from 05/17/23  was reviewed and there was no interval change after seeing and examining the patient.  Written consent was obtained from the patient after discussion of risks, benefits, and alternatives. Patient has consented to proceed with Esophagogastroduodenoscopy with possible intervention   05/17/2023 8:31 AM  Catherine Mcbride  has presented today for surgery, with the diagnosis of Duodenal stricture (HHS-HCC) (K31.5).  The various methods of treatment have been discussed with the patient and family. After consideration of risks, benefits and other options for treatment, the patient has consented to  Procedure(s): ESOPHAGOGASTRODUODENOSCOPY (EGD) (N/A) as a surgical intervention.  The patient's history has been reviewed, patient examined, no change in status, stable for surgery.  I have reviewed the patient's chart and labs.  Questions were answered to the patient's satisfaction.     [name]

## 2023-05-17 NOTE — Anesthesia Procedure Notes (Signed)
Procedure Name: General with mask airway Date/Time: 05/17/2023 8:35 AM  Performed by: Mohammed Kindle, CRNAPre-anesthesia Checklist: Patient identified, Emergency Drugs available, Suction available and Patient being monitored Patient Re-evaluated:Patient Re-evaluated prior to induction Oxygen Delivery Method: Simple face mask Induction Type: IV induction Placement Confirmation: positive ETCO2, CO2 detector and breath sounds checked- equal and bilateral Dental Injury: Teeth and Oropharynx as per pre-operative assessment

## 2023-05-18 ENCOUNTER — Encounter: Payer: Self-pay | Admitting: Gastroenterology

## 2023-05-22 ENCOUNTER — Other Ambulatory Visit: Payer: Self-pay | Admitting: Gastroenterology

## 2023-05-22 DIAGNOSIS — R932 Abnormal findings on diagnostic imaging of liver and biliary tract: Secondary | ICD-10-CM

## 2023-06-05 ENCOUNTER — Ambulatory Visit
Admission: RE | Admit: 2023-06-05 | Discharge: 2023-06-05 | Disposition: A | Discharge status: Home / Self Care | Payer: BC Managed Care – PPO | Source: Ambulatory Visit | Attending: Gastroenterology | Admitting: Gastroenterology

## 2023-06-05 DIAGNOSIS — R932 Abnormal findings on diagnostic imaging of liver and biliary tract: Secondary | ICD-10-CM

## 2023-06-05 MED ORDER — IOPAMIDOL (ISOVUE-300) INJECTION 61%
125.0000 mL | Freq: Once | INTRAVENOUS | Status: AC | PRN
  Administered 2023-06-05: 125 mL via INTRAVENOUS

## 2023-06-15 ENCOUNTER — Other Ambulatory Visit: Payer: Self-pay | Admitting: Gastroenterology

## 2023-06-15 DIAGNOSIS — K7402 Hepatic fibrosis, advanced fibrosis: Secondary | ICD-10-CM

## 2023-07-03 ENCOUNTER — Ambulatory Visit
Admission: RE | Admit: 2023-07-03 | Discharge: 2023-07-03 | Disposition: A | Discharge status: Home / Self Care | Payer: BC Managed Care – PPO | Source: Ambulatory Visit | Attending: Gastroenterology

## 2023-07-03 DIAGNOSIS — K7402 Hepatic fibrosis, advanced fibrosis: Secondary | ICD-10-CM

## 2023-10-16 ENCOUNTER — Other Ambulatory Visit: Payer: Self-pay | Admitting: Physician Assistant

## 2023-10-16 DIAGNOSIS — K7402 Hepatic fibrosis, advanced fibrosis: Secondary | ICD-10-CM

## 2023-12-04 ENCOUNTER — Ambulatory Visit
Admission: RE | Admit: 2023-12-04 | Discharge: 2023-12-04 | Disposition: A | Discharge status: Home / Self Care | Payer: BC Managed Care – PPO | Source: Ambulatory Visit | Attending: Physician Assistant | Admitting: Physician Assistant

## 2023-12-04 DIAGNOSIS — K7402 Hepatic fibrosis, advanced fibrosis: Secondary | ICD-10-CM | POA: Diagnosis present

## 2024-04-24 ENCOUNTER — Other Ambulatory Visit: Payer: Self-pay | Admitting: Gastroenterology

## 2024-04-24 DIAGNOSIS — K7402 Hepatic fibrosis, advanced fibrosis: Secondary | ICD-10-CM

## 2024-06-04 ENCOUNTER — Ambulatory Visit
Admission: RE | Admit: 2024-06-04 | Discharge: 2024-06-04 | Disposition: A | Discharge status: Home / Self Care | Source: Ambulatory Visit | Attending: Gastroenterology | Admitting: Gastroenterology

## 2024-06-04 DIAGNOSIS — K7402 Hepatic fibrosis, advanced fibrosis: Secondary | ICD-10-CM | POA: Insufficient documentation

## 2024-07-08 ENCOUNTER — Ambulatory Visit: Payer: Self-pay

## 2024-07-08 DIAGNOSIS — Z23 Encounter for immunization: Secondary | ICD-10-CM | POA: Diagnosis not present

## 2024-08-04 ENCOUNTER — Other Ambulatory Visit: Payer: Self-pay | Admitting: Family Medicine

## 2024-08-04 DIAGNOSIS — Z1231 Encounter for screening mammogram for malignant neoplasm of breast: Secondary | ICD-10-CM

## 2024-09-10 ENCOUNTER — Ambulatory Visit
Admission: RE | Admit: 2024-09-10 | Discharge: 2024-09-10 | Disposition: A | Discharge status: Home / Self Care | Source: Ambulatory Visit | Attending: Family Medicine | Admitting: Family Medicine

## 2024-09-10 DIAGNOSIS — Z1231 Encounter for screening mammogram for malignant neoplasm of breast: Secondary | ICD-10-CM | POA: Diagnosis present

## 2024-12-19 ENCOUNTER — Ambulatory Visit: Admit: 2024-12-19 | Admitting: Gastroenterology

## 2024-12-19 SURGERY — COLONOSCOPY
Anesthesia: General
